# Patient Record
Sex: Male | Born: 1985
Health system: Southern US, Community
[De-identification: ages and names within clinical notes are randomized; demographics above are authoritative.]

## PROBLEM LIST (undated history)

## (undated) DIAGNOSIS — M549 Dorsalgia, unspecified: Secondary | ICD-10-CM

## (undated) DIAGNOSIS — G4700 Insomnia, unspecified: Secondary | ICD-10-CM

## (undated) HISTORY — PX: APPENDECTOMY: SHX54

## (undated) HISTORY — PX: VASECTOMY: SHX75

---

## 2017-01-03 ENCOUNTER — Emergency Department (HOSPITAL_BASED_OUTPATIENT_CLINIC_OR_DEPARTMENT_OTHER)
Admission: EM | Admit: 2017-01-03 | Discharge: 2017-01-03 | Disposition: A | Discharge status: Home / Self Care | Attending: Emergency Medicine | Admitting: Emergency Medicine

## 2017-01-03 ENCOUNTER — Encounter (HOSPITAL_BASED_OUTPATIENT_CLINIC_OR_DEPARTMENT_OTHER): Payer: Self-pay | Admitting: Emergency Medicine

## 2017-01-03 DIAGNOSIS — J029 Acute pharyngitis, unspecified: Secondary | ICD-10-CM | POA: Diagnosis not present

## 2017-01-03 DIAGNOSIS — Z79899 Other long term (current) drug therapy: Secondary | ICD-10-CM | POA: Diagnosis not present

## 2017-01-03 DIAGNOSIS — F17220 Nicotine dependence, chewing tobacco, uncomplicated: Secondary | ICD-10-CM | POA: Insufficient documentation

## 2017-01-03 LAB — RAPID STREP SCREEN (MED CTR MEBANE ONLY): STREPTOCOCCUS, GROUP A SCREEN (DIRECT): NEGATIVE

## 2017-01-03 MED ORDER — AMOXICILLIN 500 MG PO CAPS: 500.0000 mg | ORAL_CAPSULE | Freq: Three times a day (TID) | ORAL | 0 refills | Status: DC

## 2017-01-03 NOTE — ED Provider Notes (Signed)
[facility] Provider Note   CSN: [id number] Arrival date & time: 01/03/17  0909     History   Chief Complaint Chief Complaint  Patient presents with  . Sore Throat    HPI Charles Harmon is a 31 y.o. male.  The history is provided by the patient. No language interpreter was used.  Sore Throat  This is a new problem. The current episode started more than 2 days ago. The problem occurs constantly. The problem has been gradually worsening. Nothing aggravates the symptoms. Nothing relieves the symptoms. He has tried nothing for the symptoms. The treatment provided no relief.  Pt complains of a sore throat and a headache.  Pt thought he had allergies but wife was diagnosed with strep last week  History reviewed. No pertinent past medical history.  There are no active problems to display for this patient.   Past Surgical History:  Procedure Laterality Date  . APPENDECTOMY    . VASECTOMY         Home Medications    Prior to Admission medications   Medication Sig Start Date End Date Taking? Authorizing Provider  GABAPENTIN PO Take by mouth.   Yes [provider]  amoxicillin (AMOXIL) 500 MG capsule Take 1 capsule (500 mg total) by mouth 3 (three) times daily. 01/03/17   Elson AreasSofia, Mitesh Rosendahl K, PA-C    Family History No family history on file.  Social History Social History  Substance Use Topics  . Smoking status: Former Games developermoker  . Smokeless tobacco: Current User    Types: Chew  . Alcohol use No     Allergies   Patient has no known allergies.   Review of Systems Review of Systems  All other systems reviewed and are negative.    Physical Exam Updated Vital Signs BP 124/71   Pulse 69   Temp 98.2 F (36.8 C) (Oral)   Resp 18   Ht 5\' 10"  (1.778 m)   Wt 101.2 kg   SpO2 97%   BMI 32.00 kg/m   Physical Exam  Constitutional: He appears well-developed and well-nourished.  HENT:  Head: Normocephalic.  Right Ear: External ear normal.  Left  Ear: External ear normal.  Erythema throat, no exudate,   Eyes: Conjunctivae and EOM are normal. Pupils are equal, round, and reactive to light.  Neck: Normal range of motion.  Cardiovascular: Normal rate.   Pulmonary/Chest: Effort normal.  Musculoskeletal: Normal range of motion.  Neurological: He is alert.  Skin: Skin is warm.  Psychiatric: He has a normal mood and affect.  Vitals reviewed.    ED Treatments / Results  Labs (all labs ordered are listed, but only abnormal results are displayed) Labs Reviewed  RAPID STREP SCREEN (NOT AT [facility])  CULTURE, GROUP A STREP [facility])    EKG  EKG Interpretation None       Radiology No results found.  Procedures Procedures (including critical care time)  Medications Ordered in ED Medications - No data to display   Initial Impression / Assessment and Plan / ED Course  I have reviewed the triage vital signs and the nursing notes.  Pertinent labs & imaging results that were available during my care of the patient were reviewed by me and considered in my medical decision making (see chart for details).       Final Clinical Impressions(s) / ED Diagnoses   Final diagnoses:  Pharyngitis, unspecified etiology    New Prescriptions New Prescriptions   AMOXICILLIN (AMOXIL) 500 MG CAPSULE  Take 1 capsule (500 mg total) by mouth 3 (three) times daily.  An After Visit Summary was printed and given to the patient.   Elson Areas, [place] 01/03/17 1007    Arby Barrette, MD 01/03/17 [phone]

## 2017-01-03 NOTE — Discharge Instructions (Signed)
Take Zyrtec to help with allergy symptoms

## 2017-01-03 NOTE — ED Notes (Signed)
Sore throat since Wednesday. States wife had strep last week

## 2017-01-03 NOTE — ED Triage Notes (Signed)
Pt c/o sore, itchy throat, HA and head feeling "fuzzy" x 2 d; taking OTC allergy relief med

## 2017-01-05 LAB — CULTURE, GROUP A STREP (THRC)

## 2017-03-07 ENCOUNTER — Emergency Department (HOSPITAL_BASED_OUTPATIENT_CLINIC_OR_DEPARTMENT_OTHER)

## 2017-03-07 ENCOUNTER — Inpatient Hospital Stay (HOSPITAL_BASED_OUTPATIENT_CLINIC_OR_DEPARTMENT_OTHER)
Admission: EM | Admit: 2017-03-07 | Discharge: 2017-03-09 | DRG: 922 | Disposition: A | Discharge status: Home / Self Care | Attending: Internal Medicine | Admitting: Internal Medicine

## 2017-03-07 DIAGNOSIS — X30XXXA Exposure to excessive natural heat, initial encounter: Secondary | ICD-10-CM

## 2017-03-07 DIAGNOSIS — R4182 Altered mental status, unspecified: Secondary | ICD-10-CM | POA: Diagnosis present

## 2017-03-07 DIAGNOSIS — T6701XA Heatstroke and sunstroke, initial encounter: Secondary | ICD-10-CM | POA: Diagnosis present

## 2017-03-07 DIAGNOSIS — G8929 Other chronic pain: Secondary | ICD-10-CM | POA: Diagnosis not present

## 2017-03-07 DIAGNOSIS — G9341 Metabolic encephalopathy: Secondary | ICD-10-CM | POA: Diagnosis present

## 2017-03-07 DIAGNOSIS — F17220 Nicotine dependence, chewing tobacco, uncomplicated: Secondary | ICD-10-CM | POA: Diagnosis not present

## 2017-03-07 DIAGNOSIS — M545 Low back pain: Secondary | ICD-10-CM | POA: Diagnosis not present

## 2017-03-07 DIAGNOSIS — R410 Disorientation, unspecified: Secondary | ICD-10-CM

## 2017-03-07 DIAGNOSIS — R55 Syncope and collapse: Secondary | ICD-10-CM | POA: Diagnosis present

## 2017-03-07 DIAGNOSIS — R0789 Other chest pain: Secondary | ICD-10-CM | POA: Diagnosis not present

## 2017-03-07 DIAGNOSIS — R51 Headache: Secondary | ICD-10-CM | POA: Diagnosis present

## 2017-03-07 DIAGNOSIS — T670XXA Heatstroke and sunstroke, initial encounter: Secondary | ICD-10-CM | POA: Diagnosis not present

## 2017-03-07 DIAGNOSIS — R519 Headache, unspecified: Secondary | ICD-10-CM

## 2017-03-07 LAB — CBC WITH DIFFERENTIAL/PLATELET
BASOS ABS: 0 10*3/uL (ref 0.0–0.1)
BASOS PCT: 1 %
Eosinophils Absolute: 0.1 10*3/uL (ref 0.0–0.7)
Eosinophils Relative: 1 %
HEMATOCRIT: 37.4 % — AB (ref 39.0–52.0)
HEMOGLOBIN: 13.8 g/dL (ref 13.0–17.0)
Lymphocytes Relative: 21 %
Lymphs Abs: 1.7 10*3/uL (ref 0.7–4.0)
MCH: 32.6 pg (ref 26.0–34.0)
MCHC: 36.9 g/dL — ABNORMAL HIGH (ref 30.0–36.0)
MCV: 88.4 fL (ref 78.0–100.0)
Monocytes Absolute: 0.6 10*3/uL (ref 0.1–1.0)
Monocytes Relative: 8 %
NEUTROS ABS: 5.8 10*3/uL (ref 1.7–7.7)
NEUTROS PCT: 70 %
Platelets: 231 10*3/uL (ref 150–400)
RBC: 4.23 MIL/uL (ref 4.22–5.81)
RDW: 11.9 % (ref 11.5–15.5)
WBC: 8.3 10*3/uL (ref 4.0–10.5)

## 2017-03-07 LAB — COMPREHENSIVE METABOLIC PANEL
ALK PHOS: 47 U/L (ref 38–126)
ALT: 21 U/L (ref 17–63)
ANION GAP: 12 (ref 5–15)
AST: 23 U/L (ref 15–41)
Albumin: 4.7 g/dL (ref 3.5–5.0)
BILIRUBIN TOTAL: 1.4 mg/dL — AB (ref 0.3–1.2)
BUN: 8 mg/dL (ref 6–20)
CALCIUM: 9.2 mg/dL (ref 8.9–10.3)
CO2: 25 mmol/L (ref 22–32)
Chloride: 93 mmol/L — ABNORMAL LOW (ref 101–111)
Creatinine, Ser: 1.13 mg/dL (ref 0.61–1.24)
GFR calc non Af Amer: >60 mL/min (ref >60)
Glucose, Bld: 95 mg/dL (ref 65–99)
Potassium: 3.4 mmol/L — ABNORMAL LOW (ref 3.5–5.1)
Sodium: 130 mmol/L — ABNORMAL LOW (ref 135–145)
TOTAL PROTEIN: 7.5 g/dL (ref 6.5–8.1)

## 2017-03-07 LAB — RAPID URINE DRUG SCREEN, HOSP PERFORMED
Amphetamines: NOT DETECTED
BARBITURATES: NOT DETECTED
BENZODIAZEPINES: NOT DETECTED
COCAINE: NOT DETECTED
Opiates: NOT DETECTED
TETRAHYDROCANNABINOL: NOT DETECTED

## 2017-03-07 LAB — URINALYSIS, ROUTINE W REFLEX MICROSCOPIC
Bilirubin Urine: NEGATIVE
GLUCOSE, UA: NEGATIVE mg/dL
HGB URINE DIPSTICK: NEGATIVE
KETONES UR: 15 mg/dL — AB
Leukocytes, UA: NEGATIVE
Nitrite: NEGATIVE
PROTEIN: NEGATIVE mg/dL
Specific Gravity, Urine: 1.002 — ABNORMAL LOW (ref 1.005–1.030)
pH: 6 (ref 5.0–8.0)

## 2017-03-07 LAB — CBG MONITORING, ED: Glucose-Capillary: 86 mg/dL (ref 65–99)

## 2017-03-07 LAB — I-STAT CG4 LACTIC ACID, ED: Lactic Acid, Venous: 1.11 mmol/L (ref 0.5–1.9)

## 2017-03-07 LAB — CK: Total CK: 152 U/L (ref 49–397)

## 2017-03-07 MED ORDER — SODIUM CHLORIDE 0.9 % IV SOLN
INTRAVENOUS | Status: DC
  Administered 2017-03-07: 22:00:00 via INTRAVENOUS

## 2017-03-07 MED ORDER — VANCOMYCIN HCL IN DEXTROSE 1-5 GM/200ML-% IV SOLN
1000.0000 mg | Freq: Once | INTRAVENOUS | Status: AC
  Administered 2017-03-07: 1000 mg via INTRAVENOUS
  Filled 2017-03-07: qty 200

## 2017-03-07 MED ORDER — DEXTROSE 5 % IV SOLN
2.0000 g | Freq: Once | INTRAVENOUS | Status: AC
  Administered 2017-03-07: 2 g via INTRAVENOUS
  Filled 2017-03-07: qty 2

## 2017-03-07 MED ORDER — SODIUM CHLORIDE 0.9 % IV BOLUS (SEPSIS)
1000.0000 mL | Freq: Once | INTRAVENOUS | Status: AC
  Administered 2017-03-07: 1000 mL via INTRAVENOUS

## 2017-03-07 MED ORDER — LIDOCAINE HCL 1 % IJ SOLN
INTRAMUSCULAR | Status: AC
  Filled 2017-03-07: qty 20

## 2017-03-07 MED ORDER — LORAZEPAM 2 MG/ML IJ SOLN
1.0000 mg | Freq: Once | INTRAMUSCULAR | Status: AC
  Administered 2017-03-07: 1 mg via INTRAVENOUS
  Filled 2017-03-07: qty 1

## 2017-03-07 NOTE — ED Triage Notes (Signed)
BIB spouse, pt unable to exit vehicle. Wife states he has worked out twice today and [place] [employer] [place] also out at the pool. C/o HA, CP, arm pain, LOC. Pt moaning and intermittently unresponsive.

## 2017-03-07 NOTE — ED Provider Notes (Addendum)
[facility] Provider Note   CSN: [id number] Arrival date & time: 03/07/17  2032 By signing my name below, I, Levon Hedger, attest that this documentation has been prepared under the direction and in the presence of Jacalyn Lefevre, MD . Electronically Signed: Levon Hedger, Scribe. 03/07/2017. 8:58 PM.   History   Chief Complaint Chief Complaint  Patient presents with  . Chest Pain   LEVEL 5 CAVEAT: HPI AND ROS LIMITED, PT UNRESPONSIVE     HPI Charles Harmon is a 31 y.o. male who presents to the Emergency Department complaining of sudden onset chest pain and headache which began tonight while watching television. Pt was acting strangely and wife report associated headache, arm pain.  Pt is moaning during exam and is intermittently unresponsive. Per pt's wife, pt has exercised twice today, gone to the sauna, and spent time at the pool.   The history is provided by the patient and the spouse. The history is limited by the condition of the patient. No language interpreter was used.   No past medical history on file.  There are no active problems to display for this patient.  Past Surgical History:  Procedure Laterality Date  . APPENDECTOMY    . VASECTOMY       Home Medications    Prior to Admission medications   Medication Sig Start Date End Date Taking? Authorizing Provider  amoxicillin (AMOXIL) 500 MG capsule Take 1 capsule (500 mg total) by mouth 3 (three) times daily. 01/03/17   Elson Areas, PA-C  GABAPENTIN PO Take by mouth.    [provider]    Family History No family history on file.  Social History Social History  Substance Use Topics  . Smoking status: Former Games developer  . Smokeless tobacco: Current User    Types: Chew  . Alcohol use No    Allergies   Patient has no known allergies.  Review of Systems Review of Systems  Unable to perform ROS: Patient unresponsive   Physical Exam Updated Vital Signs BP 116/68   Pulse (!) 56    Temp 97.9 F (36.6 C) (Rectal)   Resp 19   Ht 6' (1.829 m)   Wt 104.3 kg (230 lb)   SpO2 99%   BMI 31.19 kg/m   Physical Exam  Constitutional: He appears well-developed and well-nourished.  Extremely anxious  HENT:  Head: Normocephalic and atraumatic.  Eyes: Conjunctivae are normal.  Cardiovascular: Tachycardia present.   Pulmonary/Chest: Breath sounds normal. Tachypnea noted.  Abdominal: Soft. He exhibits no distension.  Musculoskeletal: Normal range of motion.  Neurological: He is alert.  Moving all extremities but not following commands well   Skin: Skin is warm and dry.  Psychiatric: His mood appears anxious.  Nursing note and vitals reviewed.   ED Treatments / Results  DIAGNOSTIC STUDIES:  Oxygen Saturation is 100% on RA, normal by my interpretation.    COORDINATION OF CARE:  8:55 PM Will order DG chest, CT head, CMP, UA, CBC, and CK. Discussed treatment plan with pt and wife  at bedside and pt/wife agreed to plan.   Labs (all labs ordered are listed, but only abnormal results are displayed) Labs Reviewed  COMPREHENSIVE METABOLIC PANEL - Abnormal; Notable for the following:       Result Value   Sodium 130 (*)    Potassium 3.4 (*)    Chloride 93 (*)    Total Bilirubin 1.4 (*)    All other components within normal limits  CBC WITH  DIFFERENTIAL/PLATELET - Abnormal; Notable for the following:    HCT 37.4 (*)    MCHC 36.9 (*)    All other components within normal limits  URINALYSIS, ROUTINE W REFLEX MICROSCOPIC - Abnormal; Notable for the following:    Specific Gravity, Urine 1.002 (*)    Ketones, ur 15 (*)    All other components within normal limits  CSF CULTURE  GRAM STAIN  CULTURE, BLOOD (ROUTINE X 2)  CULTURE, BLOOD (ROUTINE X 2)  RAPID URINE DRUG SCREEN, HOSP PERFORMED  CK  CSF CELL COUNT WITH DIFFERENTIAL  CSF CELL COUNT WITH DIFFERENTIAL  GLUCOSE, CSF  PROTEIN, CSF  HERPES SIMPLEX VIRUS(HSV) DNA BY PCR  LACTIC ACID, PLASMA  LACTIC ACID, PLASMA    CBG MONITORING, ED  I-STAT CG4 LACTIC ACID, ED    EKG  EKG Interpretation  Date/Time:  Sunday March 07 2017 20:36:33 EDT Ventricular Rate:  71 PR Interval:  154 QRS Duration: 84 QT Interval:  412 QTC Calculation: 447 R Axis:   68 Text Interpretation:  Normal sinus rhythm with sinus arrhythmia Possible Left atrial enlargement ST elevation, consider early repolarization, pericarditis, or injury Nonspecific ST abnormality Abnormal ECG No old tracing to compare Confirmed by Jacalyn Lefevre [id number]) on 03/07/2017 8:41:57 PM       Radiology Ct Head Wo Contrast  Result Date: 03/07/2017 CLINICAL DATA:  Initial evaluation for acute headache. EXAM: CT HEAD WITHOUT CONTRAST TECHNIQUE: Contiguous axial images were obtained from the base of the skull through the vertex without intravenous contrast. COMPARISON:  None. FINDINGS: Brain: Study mildly degraded by motion. Cerebral volume within normal limits for patient age. No evidence for acute intracranial hemorrhage. No findings to suggest acute large vessel territory infarct. No mass lesion, midline shift, or mass effect. Ventricles are normal in size without evidence for hydrocephalus. No obvious extra-axial fluid collection on this motion degraded exam. Vascular: No hyperdense vessel identified. Skull: Scalp soft tissues demonstrate no acute abnormality.Calvarium intact. Sinuses/Orbits: Globes and orbital soft tissues are within normal limits. Visualized paranasal sinuses are clear. No mastoid effusion. IMPRESSION: Negative head CT.  No acute intracranial process identified. Electronically Signed   By: Rise Mu M.D.   On: 03/07/2017 21:36   Dg Chest Port 1 View  Result Date: 03/07/2017 CLINICAL DATA:  Headache, intermittently unresponsive after physical exertion today. EXAM: PORTABLE CHEST 1 VIEW COMPARISON:  None. FINDINGS: The heart size and mediastinal contours are within normal limits. Both lungs are clear. The visualized skeletal  structures are unremarkable. IMPRESSION: Normal chest. Electronically Signed   By: Awilda Metro M.D.   On: 03/07/2017 21:37    Procedures Procedures (including critical care time)  Medications Ordered in ED Medications  sodium chloride 0.9 % bolus 1,000 mL (0 mLs Intravenous Stopped 03/07/17 2128)    And  0.9 %  sodium chloride infusion ( Intravenous New Bag/Given 03/07/17 2131)  lidocaine (XYLOCAINE) 1 % (with pres) injection (not administered)  vancomycin (VANCOCIN) IVPB 1000 mg/200 mL premix (1,000 mg Intravenous New Bag/Given 03/07/17 2344)  cefTRIAXone (ROCEPHIN) 2 g in dextrose 5 % 50 mL IVPB (2 g Intravenous New Bag/Given 03/07/17 2342)  LORazepam (ATIVAN) injection 1 mg (1 mg Intravenous Given 03/07/17 2054)  sodium chloride 0.9 % bolus 1,000 mL (0 mLs Intravenous Stopped 03/07/17 2333)     Initial Impression / Assessment and Plan / ED Course  I have reviewed the triage vital signs and the nursing notes.  Pertinent labs & imaging results that were available during my care of the  patient were reviewed by me and considered in my medical decision making (see chart for details).    I attempted LP, but due to pt's body habitus, I could not feel landmarks and was unsuccessful with the procedure.  I am not sure of the etiology of pt's altered mental status.  I will treat for meningitis in case that is what he has.  Pt [facility] Ophelia [facility]ates [facility]  She requested that we do a CTA of chest and abdomen.  This will be ordered.  Pt signed out to Dr. Read DriversMolpus at shift change.  Final Clinical Impressions(s) / ED Diagnoses   Final diagnoses:  Acute delirium    New Prescriptions New Prescriptions   No medications on file   I personally performed the services described in this documentation, which was scribed in my presence. The recorded information has been reviewed and is accurate.    Jacalyn LefevreHaviland, Dustin Bumbaugh, MD 03/08/17 0000    Jacalyn LefevreHaviland, Fenix Rorke, MD 03/08/17 0005

## 2017-03-07 NOTE — Progress Notes (Signed)
[facility]  Working out, spending time at the pool.  Developed acute CP, headache, unresponsiveness.  Followed instructions intermittently.  Given IVF and Ativan.  Head CT ok.  Labs unremarkable.  Considered LP, unable to perform due to poor landmarks.  Given antibiotics for empiric treatment of meningitis.  Now following commands intermittently.     Given his c/o chest pain and possible lack of cerebral perfusion, will request chest CT with contrast to assess for dissection prior to accepting the patient.  This may also help to determine which hospital is most appropriate for him.  Georgana CurioJennifer E. Brystal Kildow, M.D.  7/8 at [phone]   CTA is negative.  Still uncertain etiology.  [facility] and accept transfer at this time.  Georgana CurioJennifer E. Yoel Kaufhold, M.D. 7/9 at [phone]

## 2017-03-08 ENCOUNTER — Encounter (HOSPITAL_COMMUNITY): Payer: Self-pay | Admitting: Internal Medicine

## 2017-03-08 ENCOUNTER — Emergency Department (HOSPITAL_BASED_OUTPATIENT_CLINIC_OR_DEPARTMENT_OTHER)

## 2017-03-08 ENCOUNTER — Observation Stay (HOSPITAL_BASED_OUTPATIENT_CLINIC_OR_DEPARTMENT_OTHER)

## 2017-03-08 DIAGNOSIS — R51 Headache: Secondary | ICD-10-CM | POA: Diagnosis present

## 2017-03-08 DIAGNOSIS — R55 Syncope and collapse: Secondary | ICD-10-CM

## 2017-03-08 DIAGNOSIS — G9341 Metabolic encephalopathy: Secondary | ICD-10-CM | POA: Diagnosis present

## 2017-03-08 DIAGNOSIS — X30XXXA Exposure to excessive natural heat, initial encounter: Secondary | ICD-10-CM | POA: Diagnosis not present

## 2017-03-08 DIAGNOSIS — T6701XA Heatstroke and sunstroke, initial encounter: Secondary | ICD-10-CM | POA: Diagnosis present

## 2017-03-08 DIAGNOSIS — R4182 Altered mental status, unspecified: Secondary | ICD-10-CM | POA: Diagnosis present

## 2017-03-08 DIAGNOSIS — F17220 Nicotine dependence, chewing tobacco, uncomplicated: Secondary | ICD-10-CM | POA: Diagnosis present

## 2017-03-08 DIAGNOSIS — M545 Low back pain: Secondary | ICD-10-CM | POA: Diagnosis present

## 2017-03-08 DIAGNOSIS — G8929 Other chronic pain: Secondary | ICD-10-CM

## 2017-03-08 DIAGNOSIS — T675XXA Heat exhaustion, unspecified, initial encounter: Secondary | ICD-10-CM | POA: Diagnosis not present

## 2017-03-08 DIAGNOSIS — R410 Disorientation, unspecified: Secondary | ICD-10-CM

## 2017-03-08 DIAGNOSIS — T670XXA Heatstroke and sunstroke, initial encounter: Secondary | ICD-10-CM | POA: Diagnosis present

## 2017-03-08 DIAGNOSIS — R0789 Other chest pain: Secondary | ICD-10-CM | POA: Diagnosis present

## 2017-03-08 LAB — CBC WITH DIFFERENTIAL/PLATELET
Basophils Absolute: 0 10*3/uL (ref 0.0–0.1)
Basophils Relative: 0 %
EOS ABS: 0.1 10*3/uL (ref 0.0–0.7)
Eosinophils Relative: 3 %
HCT: 38.4 % — ABNORMAL LOW (ref 39.0–52.0)
HEMOGLOBIN: 13.8 g/dL (ref 13.0–17.0)
Lymphocytes Relative: 25 %
Lymphs Abs: 1.2 10*3/uL (ref 0.7–4.0)
MCH: 31.8 pg (ref 26.0–34.0)
MCHC: 35.9 g/dL (ref 30.0–36.0)
MCV: 88.5 fL (ref 78.0–100.0)
Monocytes Absolute: 0.5 10*3/uL (ref 0.1–1.0)
Monocytes Relative: 11 %
NEUTROS ABS: 3 10*3/uL (ref 1.7–7.7)
NEUTROS PCT: 61 %
Platelets: 204 10*3/uL (ref 150–400)
RBC: 4.34 MIL/uL (ref 4.22–5.81)
RDW: 12.5 % (ref 11.5–15.5)
WBC: 4.8 10*3/uL (ref 4.0–10.5)

## 2017-03-08 LAB — HEPATIC FUNCTION PANEL
ALBUMIN: 4.4 g/dL (ref 3.5–5.0)
ALT: 20 U/L (ref 17–63)
AST: 21 U/L (ref 15–41)
Alkaline Phosphatase: 46 U/L (ref 38–126)
BILIRUBIN TOTAL: 0.9 mg/dL (ref 0.3–1.2)
Bilirubin, Direct: 0.1 mg/dL (ref 0.1–0.5)
Indirect Bilirubin: 0.8 mg/dL (ref 0.3–0.9)
TOTAL PROTEIN: 6.9 g/dL (ref 6.5–8.1)

## 2017-03-08 LAB — ECHOCARDIOGRAM COMPLETE
Height: 72 in
Weight: 3680 oz

## 2017-03-08 LAB — BASIC METABOLIC PANEL
ANION GAP: 8 (ref 5–15)
BUN: 6 mg/dL (ref 6–20)
CO2: 27 mmol/L (ref 22–32)
Calcium: 8.6 mg/dL — ABNORMAL LOW (ref 8.9–10.3)
Chloride: 104 mmol/L (ref 101–111)
Creatinine, Ser: 1.07 mg/dL (ref 0.61–1.24)
GFR calc Af Amer: >60 mL/min (ref >60)
GFR calc non Af Amer: >60 mL/min (ref >60)
GLUCOSE: 92 mg/dL (ref 65–99)
POTASSIUM: 4.1 mmol/L (ref 3.5–5.1)
SODIUM: 139 mmol/L (ref 135–145)

## 2017-03-08 LAB — MAGNESIUM: MAGNESIUM: 2.1 mg/dL (ref 1.7–2.4)

## 2017-03-08 LAB — TROPONIN I: Troponin I: <0.03 ng/mL (ref <0.03)

## 2017-03-08 LAB — CK: CK TOTAL: 119 U/L (ref 49–397)

## 2017-03-08 LAB — HIV ANTIBODY (ROUTINE TESTING W REFLEX): HIV SCREEN 4TH GENERATION: NONREACTIVE

## 2017-03-08 LAB — SEDIMENTATION RATE: SED RATE: 1 mm/h (ref 0–16)

## 2017-03-08 MED ORDER — ACETAMINOPHEN 650 MG RE SUPP: 650.0000 mg | Freq: Four times a day (QID) | RECTAL | Status: DC | PRN

## 2017-03-08 MED ORDER — ONDANSETRON HCL 4 MG/2ML IJ SOLN
4.0000 mg | Freq: Once | INTRAMUSCULAR | Status: AC
  Administered 2017-03-08: 4 mg via INTRAVENOUS
  Filled 2017-03-08: qty 2

## 2017-03-08 MED ORDER — SODIUM CHLORIDE 0.9 % IV SOLN: INTRAVENOUS | Status: DC

## 2017-03-08 MED ORDER — DEXTROSE IN LACTATED RINGERS 5 % IV SOLN
INTRAVENOUS | Status: DC
  Administered 2017-03-08 (×2): via INTRAVENOUS

## 2017-03-08 MED ORDER — ACETAMINOPHEN 325 MG PO TABS
650.0000 mg | ORAL_TABLET | Freq: Four times a day (QID) | ORAL | Status: DC | PRN
  Administered 2017-03-08 – 2017-03-09 (×3): 650 mg via ORAL
  Filled 2017-03-08 (×3): qty 2

## 2017-03-08 MED ORDER — MORPHINE SULFATE (PF) 4 MG/ML IV SOLN
4.0000 mg | Freq: Once | INTRAVENOUS | Status: AC
  Administered 2017-03-08: 4 mg via INTRAVENOUS
  Filled 2017-03-08: qty 1

## 2017-03-08 MED ORDER — DIVALPROEX SODIUM 250 MG PO DR TAB
250.0000 mg | DELAYED_RELEASE_TABLET | Freq: Every day | ORAL | Status: DC
  Administered 2017-03-08: 250 mg via ORAL
  Filled 2017-03-08: qty 1

## 2017-03-08 MED ORDER — GABAPENTIN 300 MG PO CAPS
300.0000 mg | ORAL_CAPSULE | Freq: Two times a day (BID) | ORAL | Status: DC
  Administered 2017-03-08 – 2017-03-09 (×3): 300 mg via ORAL
  Filled 2017-03-08 (×3): qty 1

## 2017-03-08 MED ORDER — IOPAMIDOL (ISOVUE-370) INJECTION 76%
100.0000 mL | Freq: Once | INTRAVENOUS | Status: AC | PRN
  Administered 2017-03-08: 100 mL via INTRAVENOUS

## 2017-03-08 NOTE — H&P (Signed)
History and Physical    Arvo Ealy ZOX:[id number] DOB: 02/19/1986 DOA: 03/07/2017  PCP: Patient, No Pcp Per  Patient coming from: Was transferred from [facility].  Chief Complaint: Loss of consciousness. Confusion.  History obtained from patient's wife.  HPI: Charles Harmon is a 31 y.o. male with history of chronic low back pain was brought to the ER after patient was found to have loss of consciousness and confused. As per the patient's wife patient was doing fine yesterday morning. Patient had gone for jogging following which patient had worked out and had gone to swimming in the afternoon. Following this patient started developing headaches and some chest discomfort. This continued through the evening. At around 7 PM patient was resting on the bed and lost consciousness. EMS was called and patient was brought to the ER.   Patient states last week he was started on a new medication called Evekeo. Patient also has been taking gabapentin for last couple of months for back pain.  ED Course: Patient's incision was appearing confused. In the ER patient had CT of the head CT angiogram of the chest and abdomen which all were negative. Patient was not febrile. Urine drug screen was negative. Initially ER physician was trying to do lumbar puncture but was unable to do it. Patient was empirically started on antibiotics for possible meningitis. On my exam patient is alert awake oriented to time place and person and moved all extremities. Still has mild frontal headache.  Review of Systems: As per HPI, rest all negative.   History reviewed. No pertinent past medical history.  Past Surgical History:  Procedure Laterality Date  . APPENDECTOMY    . VASECTOMY       reports that he has quit smoking. His smokeless tobacco use includes Chew. He reports that he does not drink alcohol or use drugs.  No Known Allergies  History reviewed. No pertinent family history.  Prior to Admission  medications   Medication Sig Start Date End Date Taking? Authorizing Provider  gabapentin (NEURONTIN) 300 MG capsule Take 300 mg by mouth 2 (two) times daily.    [provider]    Physical Exam: Vitals:   03/08/17 0215 03/08/17 0236 03/08/17 0332 03/08/17 0521  BP: 107/67 106/69 127/63 115/60  Pulse: (!) 43 (!) 49 (!) 55 (!) 48  Resp: 11 13 14 15   Temp:   98 F (36.7 C) 97.6 F (36.4 C)  TempSrc:   Oral Oral  SpO2: 98% 97% 99% 99%  Weight:      Height:          Constitutional: Moderately built and nourished. Vitals:   03/08/17 0215 03/08/17 0236 03/08/17 0332 03/08/17 0521  BP: 107/67 106/69 127/63 115/60  Pulse: (!) 43 (!) 49 (!) 55 (!) 48  Resp: 11 13 14 15   Temp:   98 F (36.7 C) 97.6 F (36.4 C)  TempSrc:   Oral Oral  SpO2: 98% 97% 99% 99%  Weight:      Height:       Eyes: Anicteric no pallor. ENMT: No discharge from the ears eyes nose or mouth. Neck: No neck rigidity no mass. No JVD appreciated. Respiratory: No rhonchi or crepitations. Cardiovascular: S1 and S2. No murmurs appreciated. Abdomen: Soft nontender bowel sounds present. Musculoskeletal: No edema. No joint effusion. Skin: No rashes skin appears warm. Neurologic: Alert awake oriented to time place and person. Moves all extremities 5 x 5. No facial asymmetry. Tongue is midline. Psychiatric: Appears normal.  No mass effect.   Labs on Admission: I have personally reviewed following labs and imaging studies  CBC:  Recent Labs Lab 03/07/17 2040  WBC 8.3  NEUTROABS 5.8  HGB 13.8  HCT 37.4*  MCV 88.4  PLT 231   Basic Metabolic Panel:  Recent Labs Lab 03/07/17 2040  NA 130*  K 3.4*  CL 93*  CO2 25  GLUCOSE 95  BUN 8  CREATININE 1.13  CALCIUM 9.2   GFR: Estimated Creatinine Clearance: 116.1 mL/min (by C-G formula based on SCr of 1.13 mg/dL). Liver Function Tests:  Recent Labs Lab 03/07/17 2040  AST 23  ALT 21  ALKPHOS 47  BILITOT 1.4*  PROT 7.5  ALBUMIN 4.7   No  results for input(s): LIPASE, AMYLASE in the last 168 hours. No results for input(s): AMMONIA in the last 168 hours. Coagulation Profile: No results for input(s): INR, PROTIME in the last 168 hours. Cardiac Enzymes:  Recent Labs Lab 03/07/17 2040  CKTOTAL 152   BNP (last 3 results) No results for input(s): PROBNP in the last 8760 hours. HbA1C: No results for input(s): HGBA1C in the last 72 hours. CBG:  Recent Labs Lab 03/07/17 2047  GLUCAP 86   Lipid Profile: No results for input(s): CHOL, HDL, LDLCALC, TRIG, CHOLHDL, LDLDIRECT in the last 72 hours. Thyroid Function Tests: No results for input(s): TSH, T4TOTAL, FREET4, T3FREE, THYROIDAB in the last 72 hours. Anemia Panel: No results for input(s): VITAMINB12, FOLATE, FERRITIN, TIBC, IRON, RETICCTPCT in the last 72 hours. Urine analysis:    Component Value Date/Time   COLORURINE YELLOW 03/07/2017 2046   APPEARANCEUR CLEAR 03/07/2017 2046   LABSPEC 1.002 (L) 03/07/2017 2046   PHURINE 6.0 03/07/2017 2046   GLUCOSEU NEGATIVE 03/07/2017 2046   HGBUR NEGATIVE 03/07/2017 2046   BILIRUBINUR NEGATIVE 03/07/2017 2046   KETONESUR 15 (A) 03/07/2017 2046   PROTEINUR NEGATIVE 03/07/2017 2046   NITRITE NEGATIVE 03/07/2017 2046   LEUKOCYTESUR NEGATIVE 03/07/2017 2046   Sepsis Labs: @LABRCNTIP (procalcitonin:4,lacticidven:4) )No results found for this or any previous visit (from the past 240 hour(s)).   Radiological Exams on Admission: Ct Head Wo Contrast  Result Date: 03/07/2017 CLINICAL DATA:  Initial evaluation for acute headache. EXAM: CT HEAD WITHOUT CONTRAST TECHNIQUE: Contiguous axial images were obtained from the base of the skull through the vertex without intravenous contrast. COMPARISON:  None. FINDINGS: Brain: Study mildly degraded by motion. Cerebral volume within normal limits for patient age. No evidence for acute intracranial hemorrhage. No findings to suggest acute large vessel territory infarct. No mass lesion, midline  shift, or mass effect. Ventricles are normal in size without evidence for hydrocephalus. No obvious extra-axial fluid collection on this motion degraded exam. Vascular: No hyperdense vessel identified. Skull: Scalp soft tissues demonstrate no acute abnormality.Calvarium intact. Sinuses/Orbits: Globes and orbital soft tissues are within normal limits. Visualized paranasal sinuses are clear. No mastoid effusion. IMPRESSION: Negative head CT.  No acute intracranial process identified. Electronically Signed   By: Rise Mu M.D.   On: 03/07/2017 21:36   Dg Chest Port 1 View  Result Date: 03/07/2017 CLINICAL DATA:  Headache, intermittently unresponsive after physical exertion today. EXAM: PORTABLE CHEST 1 VIEW COMPARISON:  None. FINDINGS: The heart size and mediastinal contours are within normal limits. Both lungs are clear. The visualized skeletal structures are unremarkable. IMPRESSION: Normal chest. Electronically Signed   By: Awilda Metro M.D.   On: 03/07/2017 21:37   Ct Angio Chest Aorta W And/or Wo Contrast  Result Date: 03/08/2017 CLINICAL DATA:  Headache chest pain arm pain EXAM: CT ANGIOGRAPHY CHEST, ABDOMEN AND PELVIS TECHNIQUE: Multidetector CT imaging through the chest, abdomen and pelvis was performed using the standard protocol during bolus administration of intravenous contrast. Multiplanar reconstructed images and MIPs were obtained and reviewed to evaluate the vascular anatomy. CONTRAST:  100 mL Isovue 370 intravenous COMPARISON:  10/01/2014, radiograph 03/07/2017 FINDINGS: CTA CHEST FINDINGS Cardiovascular: Non contrasted images demonstrate no intramural hematoma. Non aneurysmal aorta. No dissection is seen. Normal heart size. No significant pericardial effusion. Central pulmonary artery branch vessels appear patent. Mediastinum/Nodes: No enlarged mediastinal, hilar, or axillary lymph nodes. Thyroid gland, trachea, and esophagus demonstrate no significant findings. Lungs/Pleura:  Lungs are clear. No pleural effusion or pneumothorax. Musculoskeletal: No chest wall abnormality. No acute or significant osseous findings. Review of the MIP images confirms the above findings. CTA ABDOMEN AND PELVIS FINDINGS VASCULAR Aorta: Normal caliber aorta without aneurysm, dissection, vasculitis or significant stenosis. Celiac: Patent without evidence of aneurysm, dissection, vasculitis or significant stenosis. SMA: Patent without evidence of aneurysm, dissection, vasculitis or significant stenosis. Renals: Both renal arteries are patent without evidence of aneurysm, dissection, vasculitis, fibromuscular dysplasia or significant stenosis. IMA: Patent without evidence of aneurysm, dissection, vasculitis or significant stenosis. Inflow: Patent without evidence of aneurysm, dissection, vasculitis or significant stenosis. Veins: No obvious venous abnormality within the limitations of this arterial phase study. Review of the MIP images confirms the above findings. NON-VASCULAR Hepatobiliary: No focal liver abnormality is seen. No gallstones, gallbladder wall thickening, or biliary dilatation. Pancreas: Unremarkable. No pancreatic ductal dilatation or surrounding inflammatory changes. Spleen: Heterogenous enhancement, likely due to arterial phase imaging. Spleen slightly enlarged at 15 cm. Adrenals/Urinary Tract: Adrenal glands are unremarkable. Kidneys are normal, without renal calculi, focal lesion, or hydronephrosis. Bladder is unremarkable. Stomach/Bowel: Stomach is within normal limits. Appendix not well identified, suspect appendectomy changes. No evidence of bowel wall thickening, distention, or inflammatory changes. Lymphatic: No enlarged abdominal or pelvic lymph nodes. Reproductive: Prostate is unremarkable. Other: No abdominal wall hernia or abnormality. No abdominopelvic ascites. Musculoskeletal: No acute or significant osseous findings. Review of the MIP images confirms the above findings. IMPRESSION:  1. Negative for aortic aneurysm or dissection. 2. No significant vascular disease of the thoracic or abdominal aorta or visceral vessels. 3. Slightly enlarged spleen. Heterogenous enhancement pattern most likely due to arterial phase imaging. Electronically Signed   By: Jasmine PangKim  Fujinaga M.D.   On: 03/08/2017 01:04   Ct Angio Abd/pel W And/or Wo Contrast  Result Date: 03/08/2017 CLINICAL DATA:  Headache chest pain arm pain EXAM: CT ANGIOGRAPHY CHEST, ABDOMEN AND PELVIS TECHNIQUE: Multidetector CT imaging through the chest, abdomen and pelvis was performed using the standard protocol during bolus administration of intravenous contrast. Multiplanar reconstructed images and MIPs were obtained and reviewed to evaluate the vascular anatomy. CONTRAST:  100 mL Isovue 370 intravenous COMPARISON:  10/01/2014, radiograph 03/07/2017 FINDINGS: CTA CHEST FINDINGS Cardiovascular: Non contrasted images demonstrate no intramural hematoma. Non aneurysmal aorta. No dissection is seen. Normal heart size. No significant pericardial effusion. Central pulmonary artery branch vessels appear patent. Mediastinum/Nodes: No enlarged mediastinal, hilar, or axillary lymph nodes. Thyroid gland, trachea, and esophagus demonstrate no significant findings. Lungs/Pleura: Lungs are clear. No pleural effusion or pneumothorax. Musculoskeletal: No chest wall abnormality. No acute or significant osseous findings. Review of the MIP images confirms the above findings. CTA ABDOMEN AND PELVIS FINDINGS VASCULAR Aorta: Normal caliber aorta without aneurysm, dissection, vasculitis or significant stenosis. Celiac: Patent without evidence of aneurysm, dissection, vasculitis or significant stenosis. SMA: Patent without  evidence of aneurysm, dissection, vasculitis or significant stenosis. Renals: Both renal arteries are patent without evidence of aneurysm, dissection, vasculitis, fibromuscular dysplasia or significant stenosis. IMA: Patent without evidence of  aneurysm, dissection, vasculitis or significant stenosis. Inflow: Patent without evidence of aneurysm, dissection, vasculitis or significant stenosis. Veins: No obvious venous abnormality within the limitations of this arterial phase study. Review of the MIP images confirms the above findings. NON-VASCULAR Hepatobiliary: No focal liver abnormality is seen. No gallstones, gallbladder wall thickening, or biliary dilatation. Pancreas: Unremarkable. No pancreatic ductal dilatation or surrounding inflammatory changes. Spleen: Heterogenous enhancement, likely due to arterial phase imaging. Spleen slightly enlarged at 15 cm. Adrenals/Urinary Tract: Adrenal glands are unremarkable. Kidneys are normal, without renal calculi, focal lesion, or hydronephrosis. Bladder is unremarkable. Stomach/Bowel: Stomach is within normal limits. Appendix not well identified, suspect appendectomy changes. No evidence of bowel wall thickening, distention, or inflammatory changes. Lymphatic: No enlarged abdominal or pelvic lymph nodes. Reproductive: Prostate is unremarkable. Other: No abdominal wall hernia or abnormality. No abdominopelvic ascites. Musculoskeletal: No acute or significant osseous findings. Review of the MIP images confirms the above findings. IMPRESSION: 1. Negative for aortic aneurysm or dissection. 2. No significant vascular disease of the thoracic or abdominal aorta or visceral vessels. 3. Slightly enlarged spleen. Heterogenous enhancement pattern most likely due to arterial phase imaging. Electronically Signed   By: Jasmine Pang M.D.   On: 03/08/2017 01:04    EKG: Independently reviewed. Normal sinus rhythm with QTC of 447 ms and shows early repolarization changes.  Assessment/Plan Principal Problem:   Syncope Active Problems:   Altered mental status   Acute delirium    1. Syncope with delirium - patient's delirium is resolved. Cause not clear but patient was started on a new medication a few days ago Evekeo  which will be discontinued at this time. Will check 2-D echo cycle cardiac markers check CK levels hydrate patient and check EEG. No signs of meningitis at this time. 2. History of chronic back pain on gabapentin.   DVT prophylaxis: SCDs. Code Status: Full code.  Family Communication: Patient's wife.  Disposition Plan: Home.  Consults called: None.  Admission status: Observation.    Eduard Clos MD [facility] Pager [phone].  If 7PM-7AM, please contact night-coverage [email or web address] Password [facility]  03/08/2017, 5:38 AM

## 2017-03-08 NOTE — Progress Notes (Signed)
Physical Therapy Evaluation Patient Details Name: Charles Harmon MRN: [id number] DOB: 11/03/1985 Today's Date: 03/08/2017   History of Present Illness  Pt is a 31 y.o. male with history of chronic low back pain. Pt was brought to the hospital after pt was found to have loss of consciousness and confusion following activity.  Sx started after pt had completed vigorous exercise in a hot and humid environment. Pt currently admitted for syncope.   Clinical Impression  Patient evaluated by Physical Therapy with no further acute PT needs identified. Pt was high functioning and tolerated treatment well. All education has been completed and the patient has no further questions. See below for any follow-up Physical Therapy or equipment needs. PT is signing off. Thank you for this referral.     Follow Up Recommendations No PT follow up    Equipment Recommendations  None recommended by PT    Recommendations for Other Services       Precautions / Restrictions Precautions Precautions: None Restrictions Weight Bearing Restrictions: No      Mobility  Bed Mobility Overal bed mobility: Modified Independent             General bed mobility comments: increased time to transition from supine to sit  Transfers Overall transfer level: Needs assistance   Transfers: Sit to/from Stand Sit to Stand: Supervision         General transfer comment: supervision was provided for safety as pt c/o dizziness  Ambulation/Gait Ambulation/Gait assistance: Min guard Ambulation Distance (Feet): 140 Feet Assistive device: None Gait Pattern/deviations: Decreased stride length;Trunk flexed;Step-through pattern Gait velocity: decr   General Gait Details: Pt was observed with a flexed trunk likely following residual soreness from multiple attempts at a lumbar puncture, pt complained of dizziness and had to take 2 standing rest breaks. BP was taken and was recorded as 129/65 with heart rate of 59 during  activity  and 125/71 with heart rate of 53 following activity.  Stairs            Wheelchair Mobility    Modified Rankin (Stroke Patients Only)       Balance                                             Pertinent Vitals/Pain Pain Assessment: No/denies pain    Home Living Family/patient expects to be discharged to:: Private residence Living Arrangements: Spouse/significant other Available Help at Discharge: Family                  Prior Function Level of Independence: Independent         Comments: Pt works out several times a day, is very active, and currently serves in [employer].      Hand Dominance        Extremity/Trunk Assessment   Upper Extremity Assessment Upper Extremity Assessment: Overall WFL for tasks assessed    Lower Extremity Assessment Lower Extremity Assessment: Overall WFL for tasks assessed       Communication   Communication: No difficulties  Cognition Arousal/Alertness: Awake/alert Behavior During Therapy: WFL for tasks assessed/performed Overall Cognitive Status: Within Functional Limits for tasks assessed                                        General  Comments      Exercises     Assessment/Plan    PT Assessment Patent does not need any further PT services  PT Problem List         PT Treatment Interventions      PT Goals (Current goals can be found in the Care Plan section)  Acute Rehab PT Goals Patient Stated Goal: Pt would like to return home PT Goal Formulation: With patient Time For Goal Achievement: 03/22/17 Potential to Achieve Goals: Good    Frequency     Barriers to discharge        Co-evaluation               AM-PAC PT "6 Clicks" Daily Activity  Outcome Measure Difficulty turning over in bed (including adjusting bedclothes, sheets and blankets)?: None Difficulty moving from lying on back to sitting on the side of the bed? : None Difficulty sitting  down on and standing up from a chair with arms (e.g., wheelchair, bedside commode, etc,.)?: None Help needed moving to and from a bed to chair (including a wheelchair)?: None Help needed walking in hospital room?: A Little Help needed climbing 3-5 steps with a railing? : A Little 6 Click Score: 22    End of Session Equipment Utilized During Treatment: Gait belt Activity Tolerance: Patient tolerated treatment well Patient left: in bed;with call bell/phone within reach;with family/visitor present;with bed alarm set Nurse Communication: Mobility status (nurse observed and was informed of status following treatment) PT Visit Diagnosis: Difficulty in walking, not elsewhere classified (R26.2)    Time: 1610-9604 PT Time Calculation (min) (ACUTE ONLY): 16 min   Charges:   PT Evaluation $PT Eval Low Complexity: 1 Procedure     PT G Codes:   PT G-Codes **NOT FOR INPATIENT CLASS** Functional Assessment Tool Used: AM-PAC 6 Clicks Basic Mobility;Clinical judgement Functional Limitation: Mobility: Walking and moving around Mobility: Walking and Moving Around Current Status (V4098): At least 1 percent but less than 20 percent impaired, limited or restricted Mobility: Walking and Moving Around Goal Status [phone]): At least 1 percent but less than 20 percent impaired, limited or restricted Mobility: Walking and Moving Around Discharge Status [phone]): At least 1 percent but less than 20 percent impaired, limited or restricted    Marlene Bast, SPT  Charles Harmon 03/08/2017, 3:49 PM

## 2017-03-08 NOTE — Progress Notes (Signed)
  Echocardiogram 2D Echocardiogram has been performed.  Leta JunglingCooper, Antoney Biven M 03/08/2017, 11:21 AM

## 2017-03-08 NOTE — Progress Notes (Signed)
PROGRESS NOTE    Charles Harmon  WJX:[id number] DOB: November 05, 1985 DOA: 03/07/2017 PCP: Patient, No Pcp Per    Brief Narrative:  31 year old male who presents with loss of consciousness and confusion. Patient is known to have chronic lower back pain. Apparently 24 hours prior to admission patient had very extreme workout outdoors under very high elevated temperatures, but he had a day he developed headaches and chest discomfort. Around 7 PM he was found in bed unconsciousness. On initial physical examination blood pressure 107/67, heart rate 49-55, respiratory rate 13-14, temperature 90.8, oxygen saturation 99%. Oral mucosa was dry, his lungs were clear to auscultation bilaterally, heart S1-S2 present and rhythmic, abdomen was soft nontender, no lower extremity edema. Patient was nonfocal. Sodium 1:30, potassium 3.4, chloride 93, bicarbonate 25, glucose 95, BUN 8, creatinine 1.13, white count 8.3, hemoglobin 13.8, hematocrit 37.4, platelets 231, urinalysis negative for infection, UDS negative, EKG normal sinus rhythm, positive repolarization changes with ST elevations throughout the precordium. Chest x-ray was negative for infiltrates. CT chest and abdomen no signs of dissection.  Patient admitted to the hospital working diagnosis of acute metabolic encephalopathy, complicated by syncope, likely heat stroke.       Assessment & Plan:   Principal Problem:   Syncope Active Problems:   Altered mental status   Acute delirium   1. Metabolic encephalopathy. Will continue neuro checks every 4 hours, supportive medical care with IV fluids. Patient clinically is improving, will allow patient to ambulate, follow up on EEG. Will used a balanced electrolyte solutions.  2. Chronic back pain. Will resume Depakote and gabapentin.   DVT prophylaxis: enoxaparin Code Status: Full  Family Communication: I spoke with patient's wife at the bedside and all questions were addressed.  Disposition Plan: Home    Consultants:     Procedures:   Antimicrobials:    Subjective: Patient feeling better, no nausea or vomiting, no chest pain or dyspnea, increased appetite.   Objective: Vitals:   03/08/17 0236 03/08/17 0332 03/08/17 0521 03/08/17 0700  BP: 106/69 127/63 115/60 112/60  Pulse: (!) 49 (!) 55 (!) 48 (!) 49  Resp: 13 14 15 14   Temp:  98 F (36.7 C) 97.6 F (36.4 C) 98 F (36.7 C)  TempSrc:  Oral Oral Oral  SpO2: 97% 99% 99% 100%  Weight:      Height:        Intake/Output Summary (Last 24 hours) at 03/08/17 1035 Last data filed at 03/08/17 0521  Gross per 24 hour  Intake             2150 ml  Output             1075 ml  Net             1075 ml   Filed Weights   03/07/17 2042  Weight: 104.3 kg (230 lb)    Examination:  General exam: deconditioned E ENT: no pallor, or icterus, oral mucosa moist.  Respiratory system: Clear to auscultation. Respiratory effort normal. No wheezing, rales or rhonchi.  Cardiovascular system: S1 & S2 heard, RRR. No JVD, murmurs, rubs, gallops or clicks. No pedal edema. Gastrointestinal system: Abdomen is nondistended, soft and nontender. No organomegaly or masses felt. Normal bowel sounds heard. Central nervous system: Alert and oriented. No focal neurological deficits. Extremities: Symmetric 5 x 5 power. Skin: No rashes, lesions or ulcers     Data Reviewed: I have personally reviewed following labs and imaging studies  CBC:  Recent Labs Lab 03/07/17  2040 03/08/17 0713  WBC 8.3 4.8  NEUTROABS 5.8 3.0  HGB 13.8 13.8  HCT 37.4* 38.4*  MCV 88.4 88.5  PLT 231 204   Basic Metabolic Panel:  Recent Labs Lab 03/07/17 2040 03/08/17 0713  NA 130* 139  K 3.4* 4.1  CL 93* 104  CO2 25 27  GLUCOSE 95 92  BUN 8 6  CREATININE 1.13 1.07  CALCIUM 9.2 8.6*  MG  --  2.1   GFR: Estimated Creatinine Clearance: 122.6 mL/min (by C-G formula based on SCr of 1.07 mg/dL). Liver Function Tests:  Recent Labs Lab 03/07/17 2040  03/08/17 0713  AST 23 21  ALT 21 20  ALKPHOS 47 46  BILITOT 1.4* 0.9  PROT 7.5 6.9  ALBUMIN 4.7 4.4   No results for input(s): LIPASE, AMYLASE in the last 168 hours. No results for input(s): AMMONIA in the last 168 hours. Coagulation Profile: No results for input(s): INR, PROTIME in the last 168 hours. Cardiac Enzymes:  Recent Labs Lab 03/07/17 2040 03/08/17 0713  CKTOTAL 152 119  TROPONINI  --  <0.03   BNP (last 3 results) No results for input(s): PROBNP in the last 8760 hours. HbA1C: No results for input(s): HGBA1C in the last 72 hours. CBG:  Recent Labs Lab 03/07/17 2047  GLUCAP 86   Lipid Profile: No results for input(s): CHOL, HDL, LDLCALC, TRIG, CHOLHDL, LDLDIRECT in the last 72 hours. Thyroid Function Tests: No results for input(s): TSH, T4TOTAL, FREET4, T3FREE, THYROIDAB in the last 72 hours. Anemia Panel: No results for input(s): VITAMINB12, FOLATE, FERRITIN, TIBC, IRON, RETICCTPCT in the last 72 hours. Sepsis Labs:  Recent Labs Lab 03/07/17 2350  LATICACIDVEN 1.11    No results found for this or any previous visit (from the past 240 hour(s)).       Radiology Studies: Ct Head Wo Contrast  Result Date: 03/07/2017 CLINICAL DATA:  Initial evaluation for acute headache. EXAM: CT HEAD WITHOUT CONTRAST TECHNIQUE: Contiguous axial images were obtained from the base of the skull through the vertex without intravenous contrast. COMPARISON:  None. FINDINGS: Brain: Study mildly degraded by motion. Cerebral volume within normal limits for patient age. No evidence for acute intracranial hemorrhage. No findings to suggest acute large vessel territory infarct. No mass lesion, midline shift, or mass effect. Ventricles are normal in size without evidence for hydrocephalus. No obvious extra-axial fluid collection on this motion degraded exam. Vascular: No hyperdense vessel identified. Skull: Scalp soft tissues demonstrate no acute abnormality.Calvarium intact.  Sinuses/Orbits: Globes and orbital soft tissues are within normal limits. Visualized paranasal sinuses are clear. No mastoid effusion. IMPRESSION: Negative head CT.  No acute intracranial process identified. Electronically Signed   By: Rise MuBenjamin  McClintock M.D.   On: 03/07/2017 21:36   Dg Chest Port 1 View  Result Date: 03/07/2017 CLINICAL DATA:  Headache, intermittently unresponsive after physical exertion today. EXAM: PORTABLE CHEST 1 VIEW COMPARISON:  None. FINDINGS: The heart size and mediastinal contours are within normal limits. Both lungs are clear. The visualized skeletal structures are unremarkable. IMPRESSION: Normal chest. Electronically Signed   By: Awilda Metroourtnay  Bloomer M.D.   On: 03/07/2017 21:37   Ct Angio Chest Aorta W And/or Wo Contrast  Result Date: 03/08/2017 CLINICAL DATA:  Headache chest pain arm pain EXAM: CT ANGIOGRAPHY CHEST, ABDOMEN AND PELVIS TECHNIQUE: Multidetector CT imaging through the chest, abdomen and pelvis was performed using the standard protocol during bolus administration of intravenous contrast. Multiplanar reconstructed images and MIPs were obtained and reviewed to evaluate the vascular  anatomy. CONTRAST:  100 mL Isovue 370 intravenous COMPARISON:  10/01/2014, radiograph 03/07/2017 FINDINGS: CTA CHEST FINDINGS Cardiovascular: Non contrasted images demonstrate no intramural hematoma. Non aneurysmal aorta. No dissection is seen. Normal heart size. No significant pericardial effusion. Central pulmonary artery branch vessels appear patent. Mediastinum/Nodes: No enlarged mediastinal, hilar, or axillary lymph nodes. Thyroid gland, trachea, and esophagus demonstrate no significant findings. Lungs/Pleura: Lungs are clear. No pleural effusion or pneumothorax. Musculoskeletal: No chest wall abnormality. No acute or significant osseous findings. Review of the MIP images confirms the above findings. CTA ABDOMEN AND PELVIS FINDINGS VASCULAR Aorta: Normal caliber aorta without aneurysm,  dissection, vasculitis or significant stenosis. Celiac: Patent without evidence of aneurysm, dissection, vasculitis or significant stenosis. SMA: Patent without evidence of aneurysm, dissection, vasculitis or significant stenosis. Renals: Both renal arteries are patent without evidence of aneurysm, dissection, vasculitis, fibromuscular dysplasia or significant stenosis. IMA: Patent without evidence of aneurysm, dissection, vasculitis or significant stenosis. Inflow: Patent without evidence of aneurysm, dissection, vasculitis or significant stenosis. Veins: No obvious venous abnormality within the limitations of this arterial phase study. Review of the MIP images confirms the above findings. NON-VASCULAR Hepatobiliary: No focal liver abnormality is seen. No gallstones, gallbladder wall thickening, or biliary dilatation. Pancreas: Unremarkable. No pancreatic ductal dilatation or surrounding inflammatory changes. Spleen: Heterogenous enhancement, likely due to arterial phase imaging. Spleen slightly enlarged at 15 cm. Adrenals/Urinary Tract: Adrenal glands are unremarkable. Kidneys are normal, without renal calculi, focal lesion, or hydronephrosis. Bladder is unremarkable. Stomach/Bowel: Stomach is within normal limits. Appendix not well identified, suspect appendectomy changes. No evidence of bowel wall thickening, distention, or inflammatory changes. Lymphatic: No enlarged abdominal or pelvic lymph nodes. Reproductive: Prostate is unremarkable. Other: No abdominal wall hernia or abnormality. No abdominopelvic ascites. Musculoskeletal: No acute or significant osseous findings. Review of the MIP images confirms the above findings. IMPRESSION: 1. Negative for aortic aneurysm or dissection. 2. No significant vascular disease of the thoracic or abdominal aorta or visceral vessels. 3. Slightly enlarged spleen. Heterogenous enhancement pattern most likely due to arterial phase imaging. Electronically Signed   By: Jasmine Pang M.D.   On: 03/08/2017 01:04   Ct Angio Abd/pel W And/or Wo Contrast  Result Date: 03/08/2017 CLINICAL DATA:  Headache chest pain arm pain EXAM: CT ANGIOGRAPHY CHEST, ABDOMEN AND PELVIS TECHNIQUE: Multidetector CT imaging through the chest, abdomen and pelvis was performed using the standard protocol during bolus administration of intravenous contrast. Multiplanar reconstructed images and MIPs were obtained and reviewed to evaluate the vascular anatomy. CONTRAST:  100 mL Isovue 370 intravenous COMPARISON:  10/01/2014, radiograph 03/07/2017 FINDINGS: CTA CHEST FINDINGS Cardiovascular: Non contrasted images demonstrate no intramural hematoma. Non aneurysmal aorta. No dissection is seen. Normal heart size. No significant pericardial effusion. Central pulmonary artery branch vessels appear patent. Mediastinum/Nodes: No enlarged mediastinal, hilar, or axillary lymph nodes. Thyroid gland, trachea, and esophagus demonstrate no significant findings. Lungs/Pleura: Lungs are clear. No pleural effusion or pneumothorax. Musculoskeletal: No chest wall abnormality. No acute or significant osseous findings. Review of the MIP images confirms the above findings. CTA ABDOMEN AND PELVIS FINDINGS VASCULAR Aorta: Normal caliber aorta without aneurysm, dissection, vasculitis or significant stenosis. Celiac: Patent without evidence of aneurysm, dissection, vasculitis or significant stenosis. SMA: Patent without evidence of aneurysm, dissection, vasculitis or significant stenosis. Renals: Both renal arteries are patent without evidence of aneurysm, dissection, vasculitis, fibromuscular dysplasia or significant stenosis. IMA: Patent without evidence of aneurysm, dissection, vasculitis or significant stenosis. Inflow: Patent without evidence of aneurysm, dissection, vasculitis or significant stenosis.  Veins: No obvious venous abnormality within the limitations of this arterial phase study. Review of the MIP images confirms the  above findings. NON-VASCULAR Hepatobiliary: No focal liver abnormality is seen. No gallstones, gallbladder wall thickening, or biliary dilatation. Pancreas: Unremarkable. No pancreatic ductal dilatation or surrounding inflammatory changes. Spleen: Heterogenous enhancement, likely due to arterial phase imaging. Spleen slightly enlarged at 15 cm. Adrenals/Urinary Tract: Adrenal glands are unremarkable. Kidneys are normal, without renal calculi, focal lesion, or hydronephrosis. Bladder is unremarkable. Stomach/Bowel: Stomach is within normal limits. Appendix not well identified, suspect appendectomy changes. No evidence of bowel wall thickening, distention, or inflammatory changes. Lymphatic: No enlarged abdominal or pelvic lymph nodes. Reproductive: Prostate is unremarkable. Other: No abdominal wall hernia or abnormality. No abdominopelvic ascites. Musculoskeletal: No acute or significant osseous findings. Review of the MIP images confirms the above findings. IMPRESSION: 1. Negative for aortic aneurysm or dissection. 2. No significant vascular disease of the thoracic or abdominal aorta or visceral vessels. 3. Slightly enlarged spleen. Heterogenous enhancement pattern most likely due to arterial phase imaging. Electronically Signed   By: Jasmine Pang M.D.   On: 03/08/2017 01:04        Scheduled Meds: . lidocaine       Continuous Infusions: . sodium chloride       LOS: 0 days        Mauricio Annett Gula, MD [facility] Pager [phone]  If 7PM-7AM, please contact night-coverage [email or web address] Password [email or web address] 03/08/2017, 10:35 AM

## 2017-03-08 NOTE — Progress Notes (Signed)
Patient arrived on the unit at approximately 0315 from [facility]. He is alert but drowsy. Wife at bedside. No acute problems noted at [facility] time. Will continue to monitor.

## 2017-03-08 NOTE — Progress Notes (Signed)
Gave transfer report to Jabil CircuitJoan RN on [address] at approximately [phone]. Patient left the unit for 1412 at approximately 0650 I stable condition. Patient had Tylenols 650 mg for a headache prior to transfer.

## 2017-03-09 DIAGNOSIS — T675XXA Heat exhaustion, unspecified, initial encounter: Secondary | ICD-10-CM

## 2017-03-09 DIAGNOSIS — R4182 Altered mental status, unspecified: Secondary | ICD-10-CM

## 2017-03-09 LAB — CBC WITH DIFFERENTIAL/PLATELET
Basophils Absolute: 0.1 10*3/uL (ref 0.0–0.1)
Basophils Relative: 1 %
EOS PCT: 4 %
Eosinophils Absolute: 0.2 10*3/uL (ref 0.0–0.7)
HCT: 37.1 % — ABNORMAL LOW (ref 39.0–52.0)
Hemoglobin: 13.1 g/dL (ref 13.0–17.0)
LYMPHS ABS: 2 10*3/uL (ref 0.7–4.0)
LYMPHS PCT: 38 %
MCH: 31.3 pg (ref 26.0–34.0)
MCHC: 35.3 g/dL (ref 30.0–36.0)
MCV: 88.8 fL (ref 78.0–100.0)
MONO ABS: 0.3 10*3/uL (ref 0.1–1.0)
MONOS PCT: 5 %
Neutro Abs: 2.7 10*3/uL (ref 1.7–7.7)
Neutrophils Relative %: 52 %
PLATELETS: 212 10*3/uL (ref 150–400)
RBC: 4.18 MIL/uL — AB (ref 4.22–5.81)
RDW: 12.7 % (ref 11.5–15.5)
WBC: 5.2 10*3/uL (ref 4.0–10.5)

## 2017-03-09 LAB — BASIC METABOLIC PANEL
Anion gap: 10 (ref 5–15)
BUN: 11 mg/dL (ref 6–20)
CO2: 25 mmol/L (ref 22–32)
Calcium: 8.8 mg/dL — ABNORMAL LOW (ref 8.9–10.3)
Chloride: 106 mmol/L (ref 101–111)
Creatinine, Ser: 1.03 mg/dL (ref 0.61–1.24)
GFR calc Af Amer: >60 mL/min (ref >60)
GLUCOSE: 112 mg/dL — AB (ref 65–99)
POTASSIUM: 4 mmol/L (ref 3.5–5.1)
Sodium: 141 mmol/L (ref 135–145)

## 2017-03-09 NOTE — Discharge Summary (Signed)
Physician Discharge Summary  Charles HornsJames Carrender ZOX:[id number] DOB: 12/09/1985 DOA: 03/07/2017  PCP: Patient, No Pcp Per  Admit date: 03/07/2017 Discharge date: 03/09/2017  Admitted From: Home  Disposition:  Home   Recommendations for Outpatient Follow-up:  1. Follow up with PCP in 1- week.     Home Health: No  Equipment/Devices: No   Discharge Condition: Stable  CODE STATUS: Full  Diet recommendation: Regular.   Brief/Interim Summary: 31 year old male who presents with loss of consciousness and confusion. Patient is known to have chronic lower back pain. Apparently 24 hours prior to admission patient had very extreme workout outdoors under very high temperatures, later that day he developed headaches and chest discomfort. Around 7 PM he was found in bed unconsciousness. On initial physical examination blood pressure 107/67, heart rate 49-55, respiratory rate 13-14, temperature 98., oxygen saturation 99%. Oral mucosa was dry, his lungs were clear to auscultation bilaterally, heart S1-S2 present and rhythmic, abdomen was soft nontender, no lower extremity edema. Patient was nonfocal. Sodium 130, potassium 3.4, chloride 93, bicarbonate 25, glucose 95, BUN 8, creatinine 1.13, white count 8.3, hemoglobin 13.8, hematocrit 37.4, platelets 231, urinalysis negative for infection, UDS negative, EKG normal sinus rhythm, positive repolarization changes with ST elevations throughout the precordium. Chest x-ray was negative for infiltrates. CT chest and abdomen no signs of dissection.  Patient admitted to the hospital working diagnosis of acute metabolic encephalopathy, complicated by syncope, to rule out heatstroke.   1. Heat exhaustion. Patient was admitted to the medical unit, was placed on a remote telemetry monitor, received intravenous fluids, with balance electrolyte solutions with dextrose with good toleration. His body temperature ranged between 97 and 98 Fahrenheit. His mentation improved, he was able to  ambulate, no further loss of consciousness. Echocardiography revealed normal LV function.   2. Chronic back pain. He resumed Depakote and gabapentin  3. Encephalopathy. Likely metabolic, clinically improved. No clinical signs of seizures. Lactic acid 1.1. Will recommend follow-up as an outpatient.   Discharge Diagnoses:  Principal Problem:   Syncope Active Problems:   Altered mental status   Acute delirium   Heat stroke    Discharge Instructions   Allergies as of 03/09/2017   No Known Allergies     Medication List    TAKE these medications   divalproex 250 MG 24 hr tablet Commonly known as:  DEPAKOTE ER Take 250 mg by mouth at bedtime.   EVEKEO 10 MG Tabs Generic drug:  Amphetamine Sulfate Take 10 mg by mouth daily.   gabapentin 300 MG capsule Commonly known as:  NEURONTIN Take 300 mg by mouth 2 (two) times daily.       No Known Allergies  Consultations:     Procedures/Studies: Ct Head Wo Contrast  Result Date: 03/07/2017 CLINICAL DATA:  Initial evaluation for acute headache. EXAM: CT HEAD WITHOUT CONTRAST TECHNIQUE: Contiguous axial images were obtained from the base of the skull through the vertex without intravenous contrast. COMPARISON:  None. FINDINGS: Brain: Study mildly degraded by motion. Cerebral volume within normal limits for patient age. No evidence for acute intracranial hemorrhage. No findings to suggest acute large vessel territory infarct. No mass lesion, midline shift, or mass effect. Ventricles are normal in size without evidence for hydrocephalus. No obvious extra-axial fluid collection on this motion degraded exam. Vascular: No hyperdense vessel identified. Skull: Scalp soft tissues demonstrate no acute abnormality.Calvarium intact. Sinuses/Orbits: Globes and orbital soft tissues are within normal limits. Visualized paranasal sinuses are clear. No mastoid effusion. IMPRESSION: Negative head CT.  No acute intracranial process identified.  Electronically Signed   By: Rise Mu M.D.   On: 03/07/2017 21:36   Dg Chest Port 1 View  Result Date: 03/07/2017 CLINICAL DATA:  Headache, intermittently unresponsive after physical exertion today. EXAM: PORTABLE CHEST 1 VIEW COMPARISON:  None. FINDINGS: The heart size and mediastinal contours are within normal limits. Both lungs are clear. The visualized skeletal structures are unremarkable. IMPRESSION: Normal chest. Electronically Signed   By: Awilda Metro M.D.   On: 03/07/2017 21:37   Ct Angio Chest Aorta W And/or Wo Contrast  Result Date: 03/08/2017 CLINICAL DATA:  Headache chest pain arm pain EXAM: CT ANGIOGRAPHY CHEST, ABDOMEN AND PELVIS TECHNIQUE: Multidetector CT imaging through the chest, abdomen and pelvis was performed using the standard protocol during bolus administration of intravenous contrast. Multiplanar reconstructed images and MIPs were obtained and reviewed to evaluate the vascular anatomy. CONTRAST:  100 mL Isovue 370 intravenous COMPARISON:  10/01/2014, radiograph 03/07/2017 FINDINGS: CTA CHEST FINDINGS Cardiovascular: Non contrasted images demonstrate no intramural hematoma. Non aneurysmal aorta. No dissection is seen. Normal heart size. No significant pericardial effusion. Central pulmonary artery branch vessels appear patent. Mediastinum/Nodes: No enlarged mediastinal, hilar, or axillary lymph nodes. Thyroid gland, trachea, and esophagus demonstrate no significant findings. Lungs/Pleura: Lungs are clear. No pleural effusion or pneumothorax. Musculoskeletal: No chest wall abnormality. No acute or significant osseous findings. Review of the MIP images confirms the above findings. CTA ABDOMEN AND PELVIS FINDINGS VASCULAR Aorta: Normal caliber aorta without aneurysm, dissection, vasculitis or significant stenosis. Celiac: Patent without evidence of aneurysm, dissection, vasculitis or significant stenosis. SMA: Patent without evidence of aneurysm, dissection, vasculitis or  significant stenosis. Renals: Both renal arteries are patent without evidence of aneurysm, dissection, vasculitis, fibromuscular dysplasia or significant stenosis. IMA: Patent without evidence of aneurysm, dissection, vasculitis or significant stenosis. Inflow: Patent without evidence of aneurysm, dissection, vasculitis or significant stenosis. Veins: No obvious venous abnormality within the limitations of this arterial phase study. Review of the MIP images confirms the above findings. NON-VASCULAR Hepatobiliary: No focal liver abnormality is seen. No gallstones, gallbladder wall thickening, or biliary dilatation. Pancreas: Unremarkable. No pancreatic ductal dilatation or surrounding inflammatory changes. Spleen: Heterogenous enhancement, likely due to arterial phase imaging. Spleen slightly enlarged at 15 cm. Adrenals/Urinary Tract: Adrenal glands are unremarkable. Kidneys are normal, without renal calculi, focal lesion, or hydronephrosis. Bladder is unremarkable. Stomach/Bowel: Stomach is within normal limits. Appendix not well identified, suspect appendectomy changes. No evidence of bowel wall thickening, distention, or inflammatory changes. Lymphatic: No enlarged abdominal or pelvic lymph nodes. Reproductive: Prostate is unremarkable. Other: No abdominal wall hernia or abnormality. No abdominopelvic ascites. Musculoskeletal: No acute or significant osseous findings. Review of the MIP images confirms the above findings. IMPRESSION: 1. Negative for aortic aneurysm or dissection. 2. No significant vascular disease of the thoracic or abdominal aorta or visceral vessels. 3. Slightly enlarged spleen. Heterogenous enhancement pattern most likely due to arterial phase imaging. Electronically Signed   By: Jasmine Pang M.D.   On: 03/08/2017 01:04   Ct Angio Abd/pel W And/or Wo Contrast  Result Date: 03/08/2017 CLINICAL DATA:  Headache chest pain arm pain EXAM: CT ANGIOGRAPHY CHEST, ABDOMEN AND PELVIS TECHNIQUE:  Multidetector CT imaging through the chest, abdomen and pelvis was performed using the standard protocol during bolus administration of intravenous contrast. Multiplanar reconstructed images and MIPs were obtained and reviewed to evaluate the vascular anatomy. CONTRAST:  100 mL Isovue 370 intravenous COMPARISON:  10/01/2014, radiograph 03/07/2017 FINDINGS: CTA CHEST FINDINGS Cardiovascular: Non contrasted  images demonstrate no intramural hematoma. Non aneurysmal aorta. No dissection is seen. Normal heart size. No significant pericardial effusion. Central pulmonary artery branch vessels appear patent. Mediastinum/Nodes: No enlarged mediastinal, hilar, or axillary lymph nodes. Thyroid gland, trachea, and esophagus demonstrate no significant findings. Lungs/Pleura: Lungs are clear. No pleural effusion or pneumothorax. Musculoskeletal: No chest wall abnormality. No acute or significant osseous findings. Review of the MIP images confirms the above findings. CTA ABDOMEN AND PELVIS FINDINGS VASCULAR Aorta: Normal caliber aorta without aneurysm, dissection, vasculitis or significant stenosis. Celiac: Patent without evidence of aneurysm, dissection, vasculitis or significant stenosis. SMA: Patent without evidence of aneurysm, dissection, vasculitis or significant stenosis. Renals: Both renal arteries are patent without evidence of aneurysm, dissection, vasculitis, fibromuscular dysplasia or significant stenosis. IMA: Patent without evidence of aneurysm, dissection, vasculitis or significant stenosis. Inflow: Patent without evidence of aneurysm, dissection, vasculitis or significant stenosis. Veins: No obvious venous abnormality within the limitations of this arterial phase study. Review of the MIP images confirms the above findings. NON-VASCULAR Hepatobiliary: No focal liver abnormality is seen. No gallstones, gallbladder wall thickening, or biliary dilatation. Pancreas: Unremarkable. No pancreatic ductal dilatation or  surrounding inflammatory changes. Spleen: Heterogenous enhancement, likely due to arterial phase imaging. Spleen slightly enlarged at 15 cm. Adrenals/Urinary Tract: Adrenal glands are unremarkable. Kidneys are normal, without renal calculi, focal lesion, or hydronephrosis. Bladder is unremarkable. Stomach/Bowel: Stomach is within normal limits. Appendix not well identified, suspect appendectomy changes. No evidence of bowel wall thickening, distention, or inflammatory changes. Lymphatic: No enlarged abdominal or pelvic lymph nodes. Reproductive: Prostate is unremarkable. Other: No abdominal wall hernia or abnormality. No abdominopelvic ascites. Musculoskeletal: No acute or significant osseous findings. Review of the MIP images confirms the above findings. IMPRESSION: 1. Negative for aortic aneurysm or dissection. 2. No significant vascular disease of the thoracic or abdominal aorta or visceral vessels. 3. Slightly enlarged spleen. Heterogenous enhancement pattern most likely due to arterial phase imaging. Electronically Signed   By: Jasmine Pang M.D.   On: 03/08/2017 01:04       Subjective: Patient feeling better, no nausea or vomiting, no dyspnea or chest pain. No confusion or agitation.   Discharge Exam: Vitals:   03/08/17 2123 03/09/17 0500  BP: 124/66 111/66  Pulse: (!) 51 (!) 50  Resp: 16 16  Temp: 97.7 F (36.5 C) (!) 97.5 F (36.4 C)   Vitals:   03/08/17 0700 03/08/17 1322 03/08/17 2123 03/09/17 0500  BP: 112/60 117/62 124/66 111/66  Pulse: (!) 49 (!) 57 (!) 51 (!) 50  Resp: 14 16 16 16   Temp: 98 F (36.7 C) (!) 97.5 F (36.4 C) 97.7 F (36.5 C) (!) 97.5 F (36.4 C)  TempSrc: Oral Oral Oral Oral  SpO2: 100% 96% 98% 98%  Weight:      Height:        General: Pt is alert, awake, not in acute distress E ENT. No pallor or icterus, oral mucosa moist.  Cardiovascular: RRR, S1/S2 +, no rubs, no gallops Respiratory: CTA bilaterally, no wheezing, no rhonchi Abdominal: Soft, NT,  ND, bowel sounds + Extremities: no edema, no cyanosis    The results of significant diagnostics from this hospitalization (including imaging, microbiology, ancillary and laboratory) are listed below for reference.     Microbiology: Recent Results (from the past 240 hour(s))  Blood culture (routine x 2)     Status: None (Preliminary result)   Collection Time: 03/07/17 11:15 PM  Result Value Ref Range Status   Specimen Description BLOOD RIGHT  ANTECUBITAL  Final   Special Requests   Final    BOTTLES DRAWN AEROBIC AND ANAEROBIC Blood Culture adequate volume   Culture   Final    NO GROWTH 1 DAY Performed at [facility], [address], [place], [place] [address]    Report Status PENDING  Incomplete  Blood culture (routine x 2)     Status: None (Preliminary result)   Collection Time: 03/07/17 11:30 PM  Result Value Ref Range Status   Specimen Description BLOOD RIGHT FOREARM  Final   Special Requests IN PEDIATRIC BOTTLE Blood Culture adequate volume  Final   Culture   Final    NO GROWTH 1 DAY Performed at [facility], [address] [place], [place] [address]    Report Status PENDING  Incomplete     Labs: BNP (last 3 results) No results for input(s): BNP in the last 8760 hours. Basic Metabolic Panel:  Recent Labs Lab 03/07/17 2040 03/08/17 0713 03/09/17 0449  NA 130* 139 141  K 3.4* 4.1 4.0  CL 93* 104 106  CO2 25 27 25   GLUCOSE 95 92 112*  BUN 8 6 11   CREATININE 1.13 1.07 1.03  CALCIUM 9.2 8.6* 8.8*  MG  --  2.1  --    Liver Function Tests:  Recent Labs Lab 03/07/17 2040 03/08/17 0713  AST 23 21  ALT 21 20  ALKPHOS 47 46  BILITOT 1.4* 0.9  PROT 7.5 6.9  ALBUMIN 4.7 4.4   No results for input(s): LIPASE, AMYLASE in the last 168 hours. No results for input(s): AMMONIA in the last 168 hours. CBC:  Recent Labs Lab 03/07/17 2040 03/08/17 0713 03/09/17 0449  WBC 8.3 4.8 5.2  NEUTROABS 5.8 3.0 2.7  HGB 13.8 13.8 13.1  HCT 37.4* 38.4*  37.1*  MCV 88.4 88.5 88.8  PLT 231 204 212   Cardiac Enzymes:  Recent Labs Lab 03/07/17 2040 03/08/17 0713  CKTOTAL 152 119  TROPONINI  --  <0.03   BNP: Invalid input(s): POCBNP CBG:  Recent Labs Lab 03/07/17 2047  GLUCAP 86   D-Dimer No results for input(s): DDIMER in the last 72 hours. Hgb A1c No results for input(s): HGBA1C in the last 72 hours. Lipid Profile No results for input(s): CHOL, HDL, LDLCALC, TRIG, CHOLHDL, LDLDIRECT in the last 72 hours. Thyroid function studies No results for input(s): TSH, T4TOTAL, T3FREE, THYROIDAB in the last 72 hours.  Invalid input(s): FREET3 Anemia work up No results for input(s): VITAMINB12, FOLATE, FERRITIN, TIBC, IRON, RETICCTPCT in the last 72 hours. Urinalysis    Component Value Date/Time   COLORURINE YELLOW 03/07/2017 2046   APPEARANCEUR CLEAR 03/07/2017 2046   LABSPEC 1.002 (L) 03/07/2017 2046   PHURINE 6.0 03/07/2017 2046   GLUCOSEU NEGATIVE 03/07/2017 2046   HGBUR NEGATIVE 03/07/2017 2046   BILIRUBINUR NEGATIVE 03/07/2017 2046   KETONESUR 15 (A) 03/07/2017 2046   PROTEINUR NEGATIVE 03/07/2017 2046   NITRITE NEGATIVE 03/07/2017 2046   LEUKOCYTESUR NEGATIVE 03/07/2017 2046   Sepsis Labs Invalid input(s): PROCALCITONIN,  WBC,  LACTICIDVEN Microbiology Recent Results (from the past 240 hour(s))  Blood culture (routine x 2)     Status: None (Preliminary result)   Collection Time: 03/07/17 11:15 PM  Result Value Ref Range Status   Specimen Description BLOOD RIGHT ANTECUBITAL  Final   Special Requests   Final    BOTTLES DRAWN AEROBIC AND ANAEROBIC Blood Culture adequate volume   Culture   Final    NO GROWTH  1 DAY Performed at [facility], [address] [place], [place] [address]    Report Status PENDING  Incomplete  Blood culture (routine x 2)     Status: None (Preliminary result)   Collection Time: 03/07/17 11:30 PM  Result Value Ref Range Status   Specimen Description BLOOD RIGHT FOREARM  Final    Special Requests IN PEDIATRIC BOTTLE Blood Culture adequate volume  Final   Culture   Final    NO GROWTH 1 DAY Performed at [facility], [address], [place], [place] [address]    Report Status PENDING  Incomplete     Time coordinating discharge: 45 minutes  SIGNED:   Coralie Keens, MD  [facility] 03/09/2017, 9:38 AM Pager   If 7PM-7AM, please contact night-coverage [email or web address] Password [email or web address]

## 2017-03-10 ENCOUNTER — Encounter (HOSPITAL_BASED_OUTPATIENT_CLINIC_OR_DEPARTMENT_OTHER): Payer: Self-pay | Admitting: Emergency Medicine

## 2017-03-10 ENCOUNTER — Emergency Department (HOSPITAL_BASED_OUTPATIENT_CLINIC_OR_DEPARTMENT_OTHER)
Admission: EM | Admit: 2017-03-10 | Discharge: 2017-03-10 | Disposition: A | Discharge status: Home / Self Care | Attending: Emergency Medicine | Admitting: Emergency Medicine

## 2017-03-10 DIAGNOSIS — G44219 Episodic tension-type headache, not intractable: Secondary | ICD-10-CM | POA: Insufficient documentation

## 2017-03-10 DIAGNOSIS — Z79899 Other long term (current) drug therapy: Secondary | ICD-10-CM | POA: Diagnosis not present

## 2017-03-10 DIAGNOSIS — Z87891 Personal history of nicotine dependence: Secondary | ICD-10-CM | POA: Insufficient documentation

## 2017-03-10 DIAGNOSIS — R51 Headache: Secondary | ICD-10-CM | POA: Diagnosis present

## 2017-03-10 HISTORY — DX: Insomnia, unspecified: G47.00

## 2017-03-10 HISTORY — DX: Dorsalgia, unspecified: M54.9

## 2017-03-10 MED ORDER — CYCLOBENZAPRINE HCL 10 MG PO TABS: 10.0000 mg | ORAL_TABLET | Freq: Every day | ORAL | 0 refills | Status: AC

## 2017-03-10 MED FILL — CYCLOBENZAPRINE 10 MG TABLE: 10 | 10 days supply | Qty: 10 | Fill #0

## 2017-03-10 NOTE — ED Notes (Signed)
ED Provider at bedside. 

## 2017-03-10 NOTE — ED Provider Notes (Signed)
[facility] Provider Note   CSN: [id number] Arrival date & time: 03/10/17  1523     History   Chief Complaint Chief Complaint  Patient presents with  . Headache    HPI Charles Harmon is a 31 y.o. male.  The history is provided by the patient.  Headache   This is a new problem. The current episode started yesterday. The problem occurs constantly. The problem has not changed since onset.Associated with: upper back and shoulder muscle ache. The pain is located in the occipital region. The quality of the pain is described as dull and throbbing. The pain is moderate. The pain does not radiate. Associated symptoms include nausea. Pertinent negatives include no anorexia, no fever, no malaise/fatigue, no chest pressure, no palpitations, no shortness of breath and no vomiting. He has tried NSAIDs (topical muscle creams and heat pad) for the symptoms. The treatment provided moderate (but pain returns) relief.   Of note [facility]. Patient was hydrated and recovered completely. Patient reports that he was feeling much better after being discharged however took a nap and when he awoke he had upper back pain which progressed to the headache. Denies any fevers, trauma, focal deficits.   On review of records there are no electrolyte derangements.   Past Medical History:  Diagnosis Date  . Back pain   . Insomnia     Patient Active Problem List   Diagnosis Date Noted  . Altered mental status 03/08/2017  . Syncope 03/08/2017  . Heat stroke 03/08/2017  . Acute delirium     Past Surgical History:  Procedure Laterality Date  . APPENDECTOMY    . VASECTOMY         Home Medications    Prior to Admission medications   Medication Sig Start Date End Date Taking? Authorizing Provider  Amphetamine Sulfate (EVEKEO) 10 MG TABS Take 10 mg by mouth daily.   Yes [provider]  divalproex (DEPAKOTE ER) 250 MG 24 hr  tablet Take 250 mg by mouth at bedtime.   Yes [provider]  gabapentin (NEURONTIN) 300 MG capsule Take 300 mg by mouth 2 (two) times daily.   Yes [provider]  cyclobenzaprine (FLEXERIL) 10 MG tablet Take 1 tablet (10 mg total) by mouth at bedtime. 03/10/17 03/20/17  Nira Connardama, Takela Varden Eduardo, MD    Family History No family history on file.  Social History Social History  Substance Use Topics  . Smoking status: Former Games developermoker  . Smokeless tobacco: Former NeurosurgeonUser    Types: Chew  . Alcohol use No     Allergies   Patient has no known allergies.   Review of Systems Review of Systems  Constitutional: Negative for fever and malaise/fatigue.  Respiratory: Negative for shortness of breath.   Cardiovascular: Negative for palpitations.  Gastrointestinal: Positive for nausea. Negative for anorexia and vomiting.  Neurological: Positive for headaches.  All other systems are reviewed and are negative for acute change except as noted in the HPI    Physical Exam Updated Vital Signs BP 110/80 (BP Location: Left Arm)   Pulse 63   Temp 98.1 F (36.7 C) (Oral)   Resp 18   SpO2 98%   Physical Exam  Constitutional: He is oriented to person, place, and time. He appears well-developed and well-nourished. No distress.  HENT:  Head: Normocephalic and atraumatic.  Nose: Nose normal.  Eyes: Conjunctivae and EOM are normal. Pupils are equal, round, and reactive to light.  Right eye exhibits no discharge. Left eye exhibits no discharge. No scleral icterus.  Neck: Normal range of motion. Neck supple.  Cardiovascular: Normal rate and regular rhythm.  Exam reveals no gallop and no friction rub.   No murmur heard. Pulmonary/Chest: Effort normal and breath sounds normal. No stridor. No respiratory distress. He has no rales.  Abdominal: Soft. He exhibits no distension. There is no tenderness.  Musculoskeletal: He exhibits no edema.       Cervical back: He exhibits tenderness and  spasm. He exhibits no bony tenderness.       Back:  Neurological: He is alert and oriented to person, place, and time. He has normal strength. No cranial nerve deficit or sensory deficit.  Skin: Skin is warm and dry. No rash noted. He is not diaphoretic. No erythema.  Psychiatric: He has a normal mood and affect.  Vitals reviewed.    ED Treatments / Results  Labs (all labs ordered are listed, but only abnormal results are displayed) Labs Reviewed - No data to display  EKG  EKG Interpretation None       Radiology No results found.  Procedures Procedures (including critical care time)  Medications Ordered in ED Medications - No data to display   Initial Impression / Assessment and Plan / ED Course  I have reviewed the triage vital signs and the nursing notes.  Pertinent labs & imaging results that were available during my care of the patient were reviewed by me and considered in my medical decision making (see chart for details).     Presentation is consistent with muscle strain/spasm. Do not expect any electrolyte derangements as they were within normal limits yesterday. Non focal neuro exam. No recent head trauma. No fever. Doubt meningitis. Doubt intracranial bleed. Doubt IIH. No indication for imaging.   Recommended symptomatic therapy including heat therapy, massage, stretching, over-the-counter NSAIDs. We'll prescribe patient with muscle relaxer.  The patient is safe for discharge with strict return precautions.     Final Clinical Impressions(s) / ED Diagnoses   Final diagnoses:  Episodic tension-type headache, not intractable   Disposition: Discharge  Condition: Good  I have discussed the results, Dx and Tx plan with the patient who expressed understanding and agree(s) with the plan. Discharge instructions discussed at great length. The patient was given strict return precautions who verbalized understanding of the instructions. No further questions at  time of discharge.    Discharge Medication List as of 03/10/2017  4:22 PM    START taking these medications   Details  cyclobenzaprine (FLEXERIL) 10 MG tablet Take 1 tablet (10 mg total) by mouth at bedtime., Starting Wed 03/10/2017, Until Sat 03/20/2017, Print        Follow Up: Primary care provider  Schedule an appointment as soon as possible for a visit in 2 weeks If symptoms do not improve or  worsen      Arlesia Kiel, Amadeo Garnet, MD 03/10/17 1629

## 2017-03-10 NOTE — ED Triage Notes (Addendum)
[facility] and this am has had back pain, h/a and numbness and tingling to arms . Took Motrin at 1300, 600mg 

## 2017-03-13 LAB — CULTURE, BLOOD (ROUTINE X 2)
Culture: NO GROWTH
Culture: NO GROWTH
SPECIAL REQUESTS: ADEQUATE
SPECIAL REQUESTS: ADEQUATE

## 2018-01-21 IMAGING — CT CT ANGIO CHEST
2 of 9 series · 17 of 36 positions shown · IV contrast (APPLIED)
Comparison: 10/01/2014, radiograph 03/07/2017

CLINICAL DATA: Headache chest pain arm pain

EXAM:
CT ANGIOGRAPHY CHEST, ABDOMEN AND PELVIS
TECHNIQUE: Multidetector CT imaging through the chest, abdomen and pelvis was
performed using the standard protocol during bolus administration of
intravenous contrast. Multiplanar reconstructed images and MIPs were
obtained and reviewed to evaluate the vascular anatomy.
CONTRAST:  100 mL Isovue 370 intravenous

[Series 5: axial arterial · axial · arterial · 0.76mm/px · z∈[-769,-139]mm · 16 of 240 slices shown]
[im 15/240  lung]
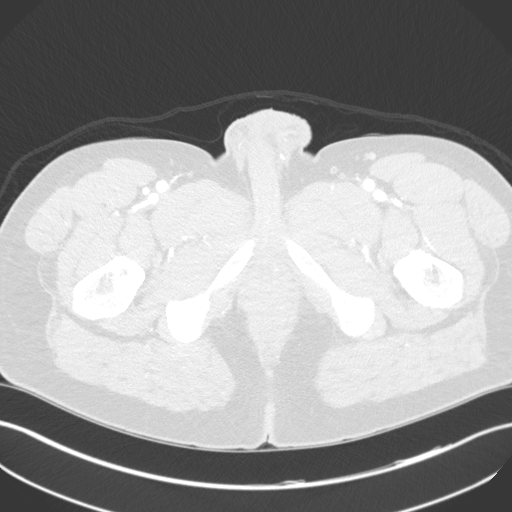
[im 29/240  mediastinal]
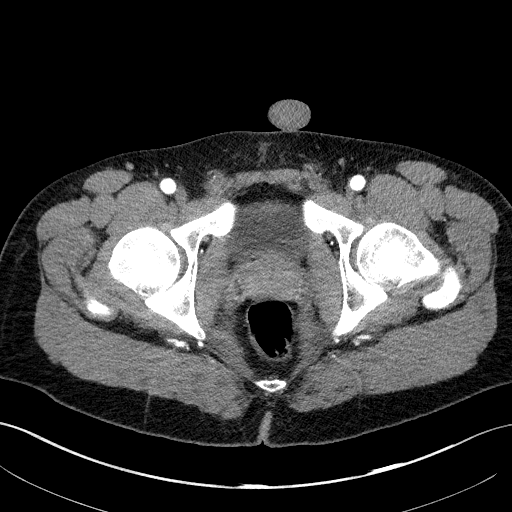
[im 43/240  lung]
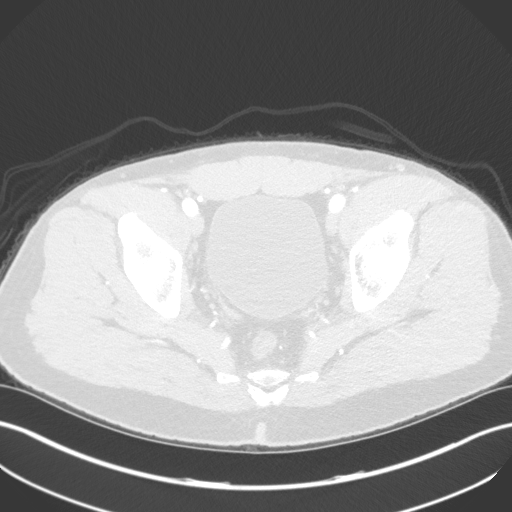
[im 57/240  mediastinal]
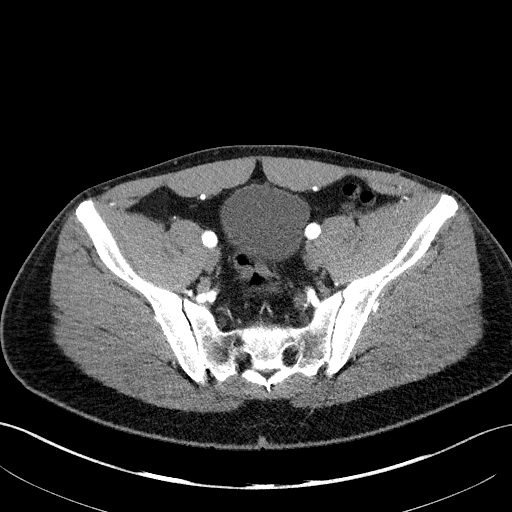
[im 71/240  lung]
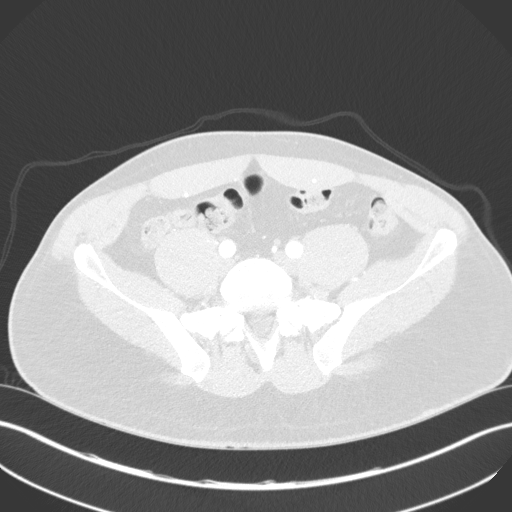
[im 85/240  mediastinal]
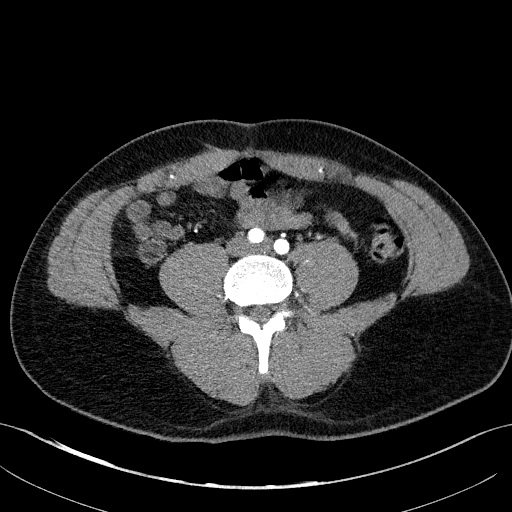
[im 99/240  lung]
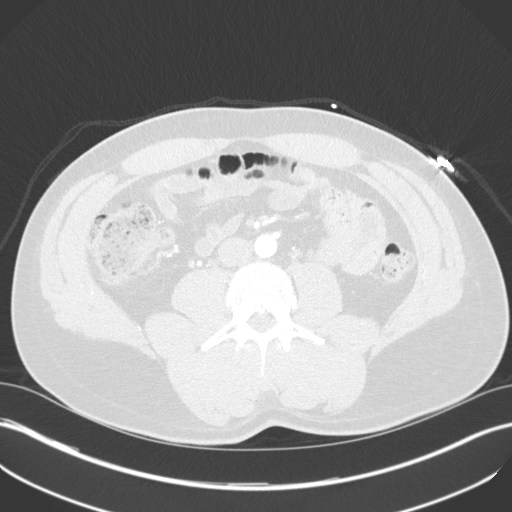
[im 113/240  mediastinal]
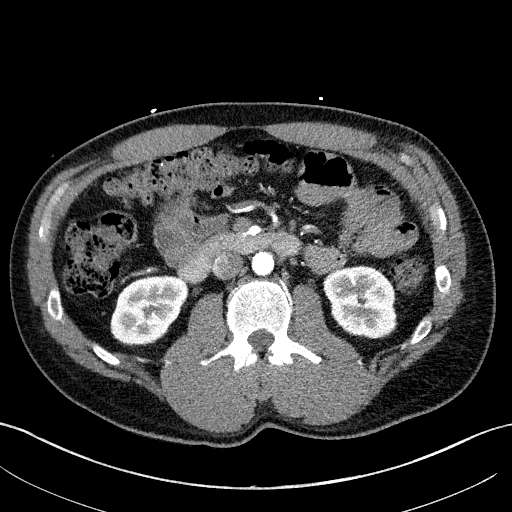
[im 127/240  lung]
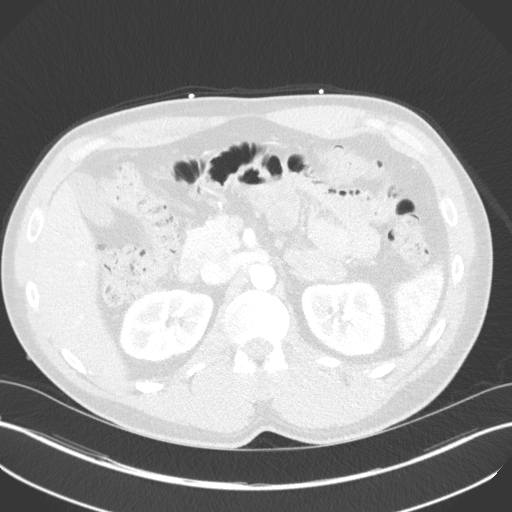
[im 141/240  mediastinal]
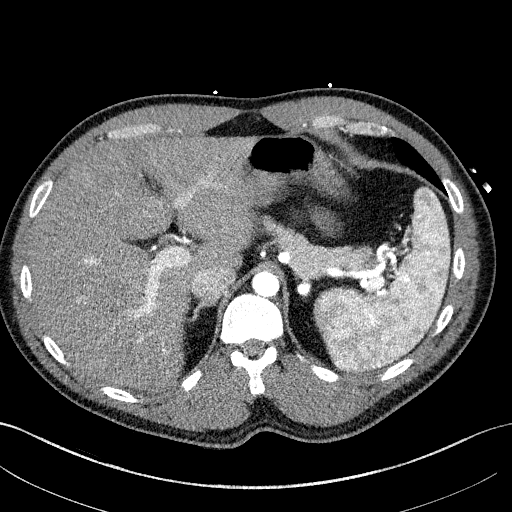
[im 155/240  lung]
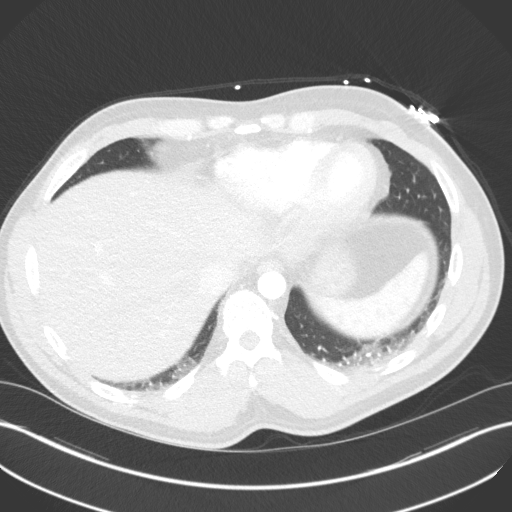
[im 169/240  mediastinal]
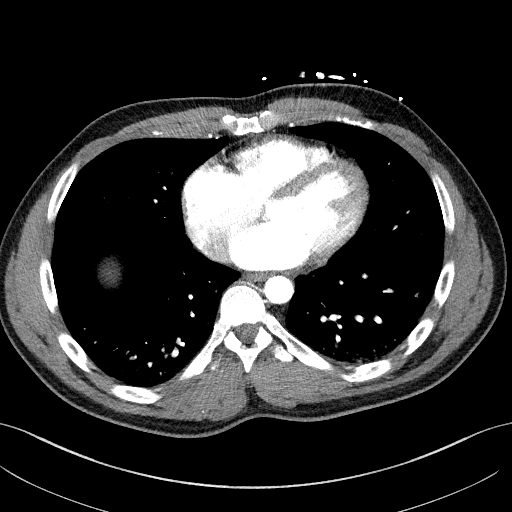
[im 183/240  lung]
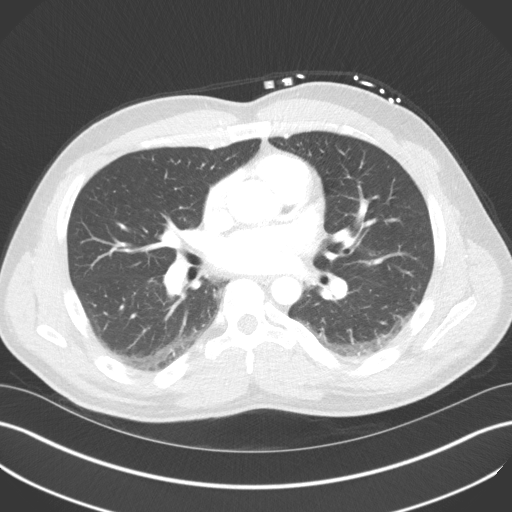
[im 197/240  mediastinal]
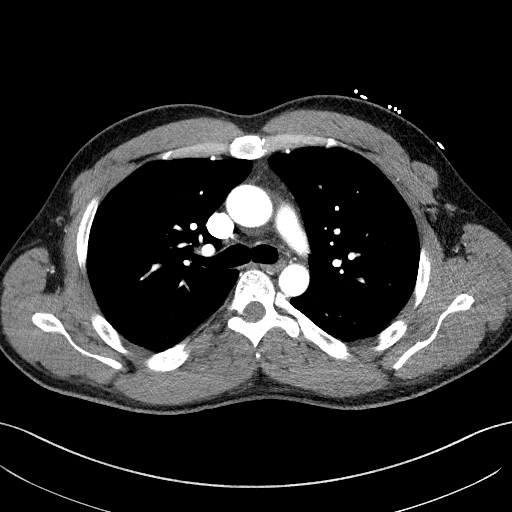
[im 211/240  lung]
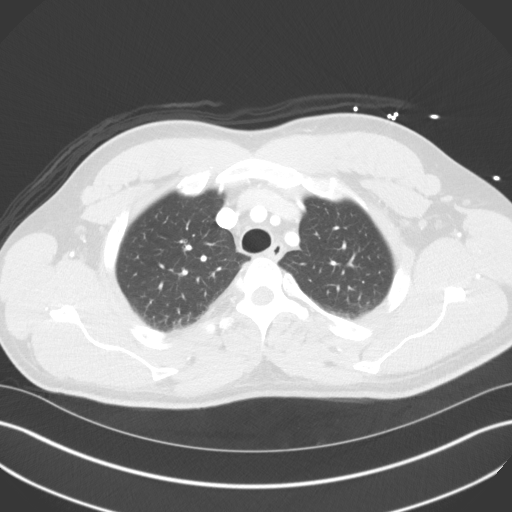
[im 225/240  mediastinal]
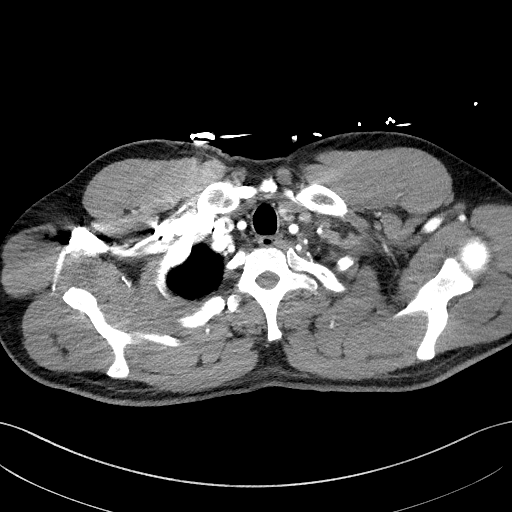

[Series 7: coronals · coronal · 0.83mm/px · 1 of 164 slices shown]
[im 82/164  mediastinal]
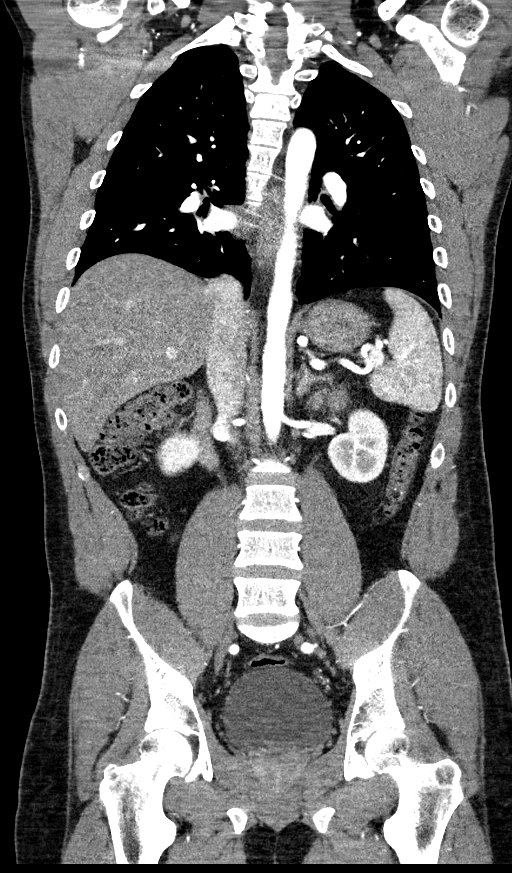

[17 of 36 positions shown; findings below may reference images not displayed]

FINDINGS: CTA CHEST FINDINGS

Cardiovascular: Non contrasted images demonstrate no intramural
hematoma. Non aneurysmal aorta. No dissection is seen. Normal heart
size. No significant pericardial effusion. Central pulmonary artery
branch vessels appear patent.

Mediastinum/Nodes: No enlarged mediastinal, hilar, or axillary lymph
nodes. Thyroid gland, trachea, and esophagus demonstrate no
significant findings.

Lungs/Pleura: Lungs are clear. No pleural effusion or pneumothorax.

Musculoskeletal: No chest wall abnormality. No acute or significant
osseous findings.

Review of the MIP images confirms the above findings.

CTA ABDOMEN AND PELVIS FINDINGS

VASCULAR

Aorta: Normal caliber aorta without aneurysm, dissection, vasculitis
or significant stenosis.

Celiac: Patent without evidence of aneurysm, dissection, vasculitis
or significant stenosis.

SMA: Patent without evidence of aneurysm, dissection, vasculitis or
significant stenosis.

Renals: Both renal arteries are patent without evidence of aneurysm,
dissection, vasculitis, fibromuscular dysplasia or significant
stenosis.

IMA: Patent without evidence of aneurysm, dissection, vasculitis or
significant stenosis.

Inflow: Patent without evidence of aneurysm, dissection, vasculitis
or significant stenosis.

Veins: No obvious venous abnormality within the limitations of this
arterial phase study.

Review of the MIP images confirms the above findings.

NON-VASCULAR

Hepatobiliary: No focal liver abnormality is seen. No gallstones,
gallbladder wall thickening, or biliary dilatation.

Pancreas: Unremarkable. No pancreatic ductal dilatation or
surrounding inflammatory changes.

Spleen: Heterogenous enhancement, likely due to arterial phase
imaging. Spleen slightly enlarged at 15 cm.

Adrenals/Urinary Tract: Adrenal glands are unremarkable. Kidneys are
normal, without renal calculi, focal lesion, or hydronephrosis.
Bladder is unremarkable.

Stomach/Bowel: Stomach is within normal limits. Appendix not well
identified, suspect appendectomy changes. No evidence of bowel wall
thickening, distention, or inflammatory changes.

Lymphatic: No enlarged abdominal or pelvic lymph nodes.

Reproductive: Prostate is unremarkable.

Other: No abdominal wall hernia or abnormality. No abdominopelvic
ascites.

Musculoskeletal: No acute or significant osseous findings.

Review of the MIP images confirms the above findings.
IMPRESSION: 1. Negative for aortic aneurysm or dissection.
2. No significant vascular disease of the thoracic or abdominal
aorta or visceral vessels.
3. Slightly enlarged spleen. Heterogenous enhancement pattern most
likely due to arterial phase imaging.
